# Patient Record
Sex: Female | Born: 1980 | Hispanic: Yes | Marital: Married | Smoking: Never smoker
Health system: Southern US, Community
[De-identification: ages and names within clinical notes are randomized; demographics above are authoritative.]

## PROBLEM LIST (undated history)

## (undated) DIAGNOSIS — J45909 Unspecified asthma, uncomplicated: Secondary | ICD-10-CM

## (undated) DIAGNOSIS — M5126 Other intervertebral disc displacement, lumbar region: Secondary | ICD-10-CM

## (undated) DIAGNOSIS — E538 Deficiency of other specified B group vitamins: Secondary | ICD-10-CM

## (undated) DIAGNOSIS — E559 Vitamin D deficiency, unspecified: Secondary | ICD-10-CM

## (undated) HISTORY — PX: INSERTION OF CONTRACEPTIVE CAPSULE: SHX680

## (undated) HISTORY — DX: Other intervertebral disc displacement, lumbar region: M51.26

## (undated) HISTORY — PX: NORPLANT REMOVAL: SHX5385

## (undated) HISTORY — DX: Deficiency of other specified B group vitamins: E53.8

## (undated) HISTORY — DX: Vitamin D deficiency, unspecified: E55.9

## (undated) HISTORY — DX: Unspecified asthma, uncomplicated: J45.909

## (undated) HISTORY — PX: OTHER SURGICAL HISTORY: SHX169

## (undated) HISTORY — PX: HIP SURGERY: SHX245

---

## 2013-10-06 HISTORY — PX: SPINE SURGERY: SHX786

## 2015-12-04 ENCOUNTER — Other Ambulatory Visit: Payer: Self-pay | Admitting: Obstetrics and Gynecology

## 2015-12-04 DIAGNOSIS — M79622 Pain in left upper arm: Secondary | ICD-10-CM

## 2015-12-10 ENCOUNTER — Ambulatory Visit
Admission: RE | Admit: 2015-12-10 | Discharge: 2015-12-10 | Disposition: A | Discharge status: Home / Self Care | Payer: 59 | Source: Ambulatory Visit | Attending: Obstetrics and Gynecology | Admitting: Obstetrics and Gynecology

## 2015-12-10 DIAGNOSIS — M79622 Pain in left upper arm: Secondary | ICD-10-CM

## 2015-12-26 ENCOUNTER — Ambulatory Visit (INDEPENDENT_AMBULATORY_CARE_PROVIDER_SITE_OTHER): Payer: 59 | Admitting: Medical

## 2015-12-26 ENCOUNTER — Encounter: Payer: Self-pay | Admitting: Medical

## 2015-12-26 VITALS — BP 128/84 | HR 74 | Ht 61.5 in | Wt 138.0 lb

## 2015-12-26 DIAGNOSIS — K5909 Other constipation: Secondary | ICD-10-CM

## 2015-12-26 DIAGNOSIS — K5900 Constipation, unspecified: Secondary | ICD-10-CM

## 2015-12-26 DIAGNOSIS — N83201 Unspecified ovarian cyst, right side: Secondary | ICD-10-CM

## 2015-12-26 DIAGNOSIS — R21 Rash and other nonspecific skin eruption: Secondary | ICD-10-CM

## 2015-12-26 LAB — TSH: TSH: 1.67 mIU/L

## 2015-12-26 LAB — CBC
HEMATOCRIT: 36.7 % (ref 36.0–46.0)
HEMOGLOBIN: 12.5 g/dL (ref 12.0–15.0)
MCH: 30 pg (ref 26.0–34.0)
MCHC: 34.1 g/dL (ref 30.0–36.0)
MCV: 88.2 fL (ref 78.0–100.0)
MPV: 11.3 fL (ref 8.6–12.4)
Platelets: 250 10*3/uL (ref 150–400)
RBC: 4.16 MIL/uL (ref 3.87–5.11)
RDW: 13 % (ref 11.5–15.5)
WBC: 5.7 10*3/uL (ref 4.0–10.5)

## 2015-12-26 MED ORDER — LINACLOTIDE 145 MCG PO CAPS: 145.0000 ug | ORAL_CAPSULE | Freq: Every day | ORAL | Status: DC

## 2015-12-26 NOTE — Progress Notes (Signed)
Subjective: Chief Complaint  Patient presents with  . New Patient (Initial Visit)    pt states that she has been getting bloating and pain in her lower abdomin area. has a cyst in her ovary but not sure if it related. Said passing gas or burping helps but has to take a laxative in order to go to the bathroom. Has a rash everytime she has her cycle.    Here as a new patient. From [place], moved to [place] in June.  Works for [employer]. Moved here for work.   She has several year hx/o constipation, bloating, gassy.   She notes having an ovarian cyst the size of a lemon on the right, not sure if this is related.   Keeps getting a lot of abdominal bloating, can't go to the bathroom without using a laxative, gets nauseated from feeling full all the time.  Pain is so bad that she bends over in pain.  Has BM once weekly if she uses laxative.   Has had this problems for years.  Drinks only water and coffee.  No juice, no soda, no tea.   Eats a good amount of fiber. Eats vegetables daily, good amount of grains.  Eats spinach most days.  Has used miralax occasionally, not sure it helps some.   No prior fiber supplement.   Eats a lot of onions ,but not so much cheese.  Sometimes eats beans.   Stool is often hard.  Has seen occasional bright red blood on the tissue some, with painful BMs.  Has had hemorrhoids occasionally.  Had colonoscopy years for same problems and was reportedly normal.  No polyps, no fissure.    Also has several year hx/o getting rash on forearm, legs, and abdomen.  Took a photo of this.  It comes on when period comes, but then slowly disappears within a week or 2, recurring every month.  LMP 12/17/15, lasted 5 days, but rash is still lingering, embarrassing.     The ovarian cyst is the size of a lemon.  Has hx/o abnormal pap, but HPV was negative 11/2015.  Is suppose to have repeat pap in 1 year.     History reviewed. No pertinent past medical history.  ROS as in  subjective   Objective BP 128/84 mmHg  Pulse 74  Ht 5' 1.5" (1.562 m)  Wt 138 lb (62.596 kg)  BMI 25.66 kg/m2  LMP 12/17/2015  General appearance: alert, no distress, WD/WN Oral cavity: MMM, no lesions Neck: supple, no lymphadenopathy, no thyromegaly, no masses Heart: RRR, normal S1, S2, no murmurs Lungs: CTA bilaterally, no wheezes, rhonchi, or rales Abdomen: +bs, soft, non tender, non distended, no masses, no hepatomegaly, no splenomegaly Pulses: 2+ symmetric, upper and lower extremities, normal cap refill Skin: faint maculopapular rash diffuse on forearms, lower legs     Assessment: Encounter Diagnoses  Name Primary?  . Chronic constipation Yes  . Cyst of right ovary   . Rash and nonspecific skin eruption       Plan: discussed concerns and symptoms that represent chronic constipation.  Begin trial of Linzess, gave samples and script.  discussed water and fiber intake.  Avoid onions.  F/u in 75mo.  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 I will discuss with Dr. Tomi Bamberger here and give her some feedback on this.

## 2016-01-29 ENCOUNTER — Ambulatory Visit (INDEPENDENT_AMBULATORY_CARE_PROVIDER_SITE_OTHER): Payer: 59 | Admitting: Medical

## 2016-01-29 ENCOUNTER — Encounter: Payer: Self-pay | Admitting: Medical

## 2016-01-29 VITALS — BP 120/84 | HR 75 | Wt 140.0 lb

## 2016-01-29 DIAGNOSIS — K5900 Constipation, unspecified: Secondary | ICD-10-CM | POA: Diagnosis not present

## 2016-01-29 DIAGNOSIS — K5909 Other constipation: Secondary | ICD-10-CM

## 2016-01-29 DIAGNOSIS — R21 Rash and other nonspecific skin eruption: Secondary | ICD-10-CM | POA: Diagnosis not present

## 2016-01-29 NOTE — Progress Notes (Signed)
Subjective: Chief Complaint  Patient presents with  . Follow-up    on linzess. no problems or concerns   Here for f/u on constipation.  I saw her a month ago as a new patient for constipation.   She has several year hx/o constipation, bloating, gassy.   She notes having an ovarian cyst the size of a lemon on the right, sees [facility] for this.   Has used miralax occasionally, not sure it helps some.   No prior fiber supplement.  Had colonoscopy years for same problems and was reportedly normal.  No polyps, no fissure.  Since last visit has seen slight improvement in bowel movements but still only getting 1-2 BMs weekly. She did cut out onions.     Also has several year hx/o getting rash on forearm, legs, and abdomen.   It comes on when period comes, but then slowly disappears within a week or 2, recurring every month.  History reviewed. No pertinent past medical history.  ROS as in subjective   Objective BP 120/84 mmHg  Pulse 75  Wt 140 lb (63.504 kg)  LMP 01/18/2016  General appearance: alert, no distress, WD/WN    Assessment: Encounter Diagnoses  Name Primary?  . Chronic constipation Yes  . Rash and nonspecific skin eruption       Plan: C/t good water and fiber intake, increase to Linzess 2105mcg, but also gave samples of Amitiza to try as well.   Discussed concerns and symptoms that represent chronic constipation.  Let me know in 2 weeks how this is doing.  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  We reviewed this again.   She will discuss with gynecology but if needed we can refer to dermatology as it sounds like her gynecologist didn't want to pursue eval/treatment for this based on patient's view of her recent visit with gyn.  Matilda was seen today for follow-up.  Diagnoses and all orders for this visit:  Chronic constipation  Rash and nonspecific skin eruption  Other orders -     linaclotide (LINZESS) 290 MCG CAPS capsule; Take 1  capsule (290 mcg total) by mouth daily before breakfast.

## 2016-01-29 NOTE — Patient Instructions (Signed)
Begin samples of Linzess 290 mcg once daily When you run out of Linzess, change to Amitiza  Take Amitiza 24 mcg twice daily to see if this works Therapist, nutritional?

## 2016-01-30 ENCOUNTER — Encounter: Payer: Self-pay | Admitting: Medical

## 2016-01-30 MED ORDER — LINACLOTIDE 290 MCG PO CAPS: 290.0000 ug | ORAL_CAPSULE | Freq: Every day | ORAL | Status: DC

## 2016-05-28 ENCOUNTER — Encounter (HOSPITAL_COMMUNITY): Payer: Self-pay | Admitting: Radiology

## 2016-05-28 ENCOUNTER — Emergency Department (HOSPITAL_COMMUNITY)
Admission: EM | Admit: 2016-05-28 | Discharge: 2016-05-28 | Disposition: A | Discharge status: Home / Self Care | Payer: 59 | Attending: Emergency Medicine | Admitting: Emergency Medicine

## 2016-05-28 ENCOUNTER — Emergency Department (HOSPITAL_COMMUNITY): Payer: 59

## 2016-05-28 DIAGNOSIS — Y999 Unspecified external cause status: Secondary | ICD-10-CM | POA: Diagnosis not present

## 2016-05-28 DIAGNOSIS — S8991XA Unspecified injury of right lower leg, initial encounter: Secondary | ICD-10-CM | POA: Diagnosis present

## 2016-05-28 DIAGNOSIS — R0789 Other chest pain: Secondary | ICD-10-CM | POA: Diagnosis not present

## 2016-05-28 DIAGNOSIS — S3991XA Unspecified injury of abdomen, initial encounter: Secondary | ICD-10-CM

## 2016-05-28 DIAGNOSIS — S50811A Abrasion of right forearm, initial encounter: Secondary | ICD-10-CM | POA: Diagnosis not present

## 2016-05-28 DIAGNOSIS — Y9389 Activity, other specified: Secondary | ICD-10-CM | POA: Insufficient documentation

## 2016-05-28 DIAGNOSIS — S8011XA Contusion of right lower leg, initial encounter: Secondary | ICD-10-CM | POA: Insufficient documentation

## 2016-05-28 DIAGNOSIS — Y92410 Unspecified street and highway as the place of occurrence of the external cause: Secondary | ICD-10-CM | POA: Insufficient documentation

## 2016-05-28 DIAGNOSIS — S40811A Abrasion of right upper arm, initial encounter: Secondary | ICD-10-CM | POA: Insufficient documentation

## 2016-05-28 DIAGNOSIS — T148XXA Other injury of unspecified body region, initial encounter: Secondary | ICD-10-CM

## 2016-05-28 LAB — CBC WITH DIFFERENTIAL/PLATELET
BASOS ABS: 0 10*3/uL (ref 0.0–0.1)
Basophils Relative: 0 %
EOS PCT: 2 %
Eosinophils Absolute: 0.1 10*3/uL (ref 0.0–0.7)
HCT: 36.8 % (ref 36.0–46.0)
Hemoglobin: 13 g/dL (ref 12.0–15.0)
LYMPHS PCT: 30 %
Lymphs Abs: 1.6 10*3/uL (ref 0.7–4.0)
MCH: 30.6 pg (ref 26.0–34.0)
MCHC: 35.3 g/dL (ref 30.0–36.0)
MCV: 86.6 fL (ref 78.0–100.0)
MONO ABS: 0.3 10*3/uL (ref 0.1–1.0)
Monocytes Relative: 6 %
Neutro Abs: 3.4 10*3/uL (ref 1.7–7.7)
Neutrophils Relative %: 62 %
PLATELETS: 195 10*3/uL (ref 150–400)
RBC: 4.25 MIL/uL (ref 3.87–5.11)
RDW: 12.1 % (ref 11.5–15.5)
WBC: 5.4 10*3/uL (ref 4.0–10.5)

## 2016-05-28 LAB — COMPREHENSIVE METABOLIC PANEL
ALBUMIN: 4.4 g/dL (ref 3.5–5.0)
ALK PHOS: 68 U/L (ref 38–126)
ALT: 13 U/L — ABNORMAL LOW (ref 14–54)
AST: 21 U/L (ref 15–41)
Anion gap: 5 (ref 5–15)
BILIRUBIN TOTAL: 0.5 mg/dL (ref 0.3–1.2)
BUN: 12 mg/dL (ref 6–20)
CO2: 26 mmol/L (ref 22–32)
Calcium: 8.9 mg/dL (ref 8.9–10.3)
Chloride: 108 mmol/L (ref 101–111)
Creatinine, Ser: 0.71 mg/dL (ref 0.44–1.00)
GFR calc Af Amer: >60 mL/min (ref >60)
GFR calc non Af Amer: >60 mL/min (ref >60)
GLUCOSE: 111 mg/dL — AB (ref 65–99)
POTASSIUM: 3.7 mmol/L (ref 3.5–5.1)
Sodium: 139 mmol/L (ref 135–145)
TOTAL PROTEIN: 7.4 g/dL (ref 6.5–8.1)

## 2016-05-28 LAB — URINALYSIS, ROUTINE W REFLEX MICROSCOPIC
BILIRUBIN URINE: NEGATIVE
GLUCOSE, UA: NEGATIVE mg/dL
HGB URINE DIPSTICK: NEGATIVE
Ketones, ur: NEGATIVE mg/dL
Leukocytes, UA: NEGATIVE
Nitrite: NEGATIVE
PH: 7 (ref 5.0–8.0)
Protein, ur: NEGATIVE mg/dL
SPECIFIC GRAVITY, URINE: 1.007 (ref 1.005–1.030)

## 2016-05-28 LAB — I-STAT BETA HCG BLOOD, ED (MC, WL, AP ONLY): I-stat hCG, quantitative: <5 m[IU]/mL (ref <5)

## 2016-05-28 MED ORDER — IBUPROFEN 600 MG PO TABS: 600.0000 mg | ORAL_TABLET | Freq: Four times a day (QID) | ORAL | 0 refills | Status: DC | PRN

## 2016-05-28 MED ORDER — SODIUM CHLORIDE 0.9 % IV SOLN
INTRAVENOUS | Status: DC
  Administered 2016-05-28: 10:00:00 via INTRAVENOUS

## 2016-05-28 MED ORDER — TRAMADOL HCL 50 MG PO TABS: 100.0000 mg | ORAL_TABLET | Freq: Four times a day (QID) | ORAL | 0 refills | Status: DC | PRN

## 2016-05-28 MED ORDER — IOPAMIDOL (ISOVUE-300) INJECTION 61%
100.0000 mL | Freq: Once | INTRAVENOUS | Status: AC | PRN
  Administered 2016-05-28: 100 mL via INTRAVENOUS

## 2016-05-28 MED ORDER — MORPHINE SULFATE (PF) 4 MG/ML IV SOLN
4.0000 mg | Freq: Once | INTRAVENOUS | Status: AC
  Administered 2016-05-28: 4 mg via INTRAVENOUS
  Filled 2016-05-28: qty 1

## 2016-05-28 MED ORDER — BACITRACIN ZINC 500 UNIT/GM EX OINT
TOPICAL_OINTMENT | CUTANEOUS | Status: AC
  Administered 2016-05-28: 2
  Filled 2016-05-28: qty 1.8
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
## 2016-05-29 ENCOUNTER — Emergency Department (HOSPITAL_COMMUNITY): Payer: 59

## 2016-05-29 ENCOUNTER — Emergency Department (HOSPITAL_COMMUNITY)
Admission: EM | Admit: 2016-05-29 | Discharge: 2016-05-29 | Disposition: A | Discharge status: Home / Self Care | Payer: 59 | Attending: Emergency Medicine | Admitting: Emergency Medicine

## 2016-05-29 ENCOUNTER — Encounter (HOSPITAL_COMMUNITY): Payer: Self-pay | Admitting: Emergency Medicine

## 2016-05-29 DIAGNOSIS — Y999 Unspecified external cause status: Secondary | ICD-10-CM | POA: Diagnosis not present

## 2016-05-29 DIAGNOSIS — Z79891 Long term (current) use of opiate analgesic: Secondary | ICD-10-CM | POA: Diagnosis not present

## 2016-05-29 DIAGNOSIS — R112 Nausea with vomiting, unspecified: Secondary | ICD-10-CM | POA: Diagnosis present

## 2016-05-29 DIAGNOSIS — R0602 Shortness of breath: Secondary | ICD-10-CM | POA: Diagnosis not present

## 2016-05-29 DIAGNOSIS — Y92410 Unspecified street and highway as the place of occurrence of the external cause: Secondary | ICD-10-CM | POA: Insufficient documentation

## 2016-05-29 DIAGNOSIS — R0789 Other chest pain: Secondary | ICD-10-CM

## 2016-05-29 DIAGNOSIS — Y939 Activity, unspecified: Secondary | ICD-10-CM | POA: Insufficient documentation

## 2016-05-29 LAB — URINALYSIS, ROUTINE W REFLEX MICROSCOPIC
BILIRUBIN URINE: NEGATIVE
GLUCOSE, UA: NEGATIVE mg/dL
HGB URINE DIPSTICK: NEGATIVE
KETONES UR: NEGATIVE mg/dL
Leukocytes, UA: NEGATIVE
Nitrite: NEGATIVE
PROTEIN: 30 mg/dL — AB
Specific Gravity, Urine: 1.027 (ref 1.005–1.030)
pH: 7.5 (ref 5.0–8.0)

## 2016-05-29 LAB — COMPREHENSIVE METABOLIC PANEL
ALK PHOS: 68 U/L (ref 38–126)
ALT: 17 U/L (ref 14–54)
ANION GAP: 7 (ref 5–15)
AST: 25 U/L (ref 15–41)
Albumin: 4.7 g/dL (ref 3.5–5.0)
BILIRUBIN TOTAL: 0.7 mg/dL (ref 0.3–1.2)
BUN: 14 mg/dL (ref 6–20)
CHLORIDE: 104 mmol/L (ref 101–111)
CO2: 26 mmol/L (ref 22–32)
CREATININE: 0.72 mg/dL (ref 0.44–1.00)
Calcium: 9 mg/dL (ref 8.9–10.3)
GFR calc non Af Amer: >60 mL/min (ref >60)
Glucose, Bld: 116 mg/dL — ABNORMAL HIGH (ref 65–99)
Potassium: 3.7 mmol/L (ref 3.5–5.1)
Sodium: 137 mmol/L (ref 135–145)
Total Protein: 7.6 g/dL (ref 6.5–8.1)

## 2016-05-29 LAB — CBC
HCT: 39 % (ref 36.0–46.0)
Hemoglobin: 13.3 g/dL (ref 12.0–15.0)
MCH: 30.3 pg (ref 26.0–34.0)
MCHC: 34.1 g/dL (ref 30.0–36.0)
MCV: 88.8 fL (ref 78.0–100.0)
PLATELETS: 197 10*3/uL (ref 150–400)
RBC: 4.39 MIL/uL (ref 3.87–5.11)
RDW: 12.3 % (ref 11.5–15.5)
WBC: 7.3 10*3/uL (ref 4.0–10.5)

## 2016-05-29 LAB — URINE MICROSCOPIC-ADD ON

## 2016-05-29 LAB — I-STAT TROPONIN, ED: Troponin i, poc: 0 ng/mL (ref 0.00–0.08)

## 2016-05-29 LAB — LIPASE, BLOOD: Lipase: 14 U/L (ref 11–51)

## 2016-05-29 MED ORDER — METHOCARBAMOL 500 MG PO TABS: 500.0000 mg | ORAL_TABLET | Freq: Four times a day (QID) | ORAL | 0 refills | Status: DC

## 2016-05-29 MED ORDER — KETOROLAC TROMETHAMINE 60 MG/2ML IM SOLN
60.0000 mg | Freq: Once | INTRAMUSCULAR | Status: AC
  Administered 2016-05-29: 60 mg via INTRAMUSCULAR
  Filled 2016-05-29: qty 2

## 2016-05-29 MED ORDER — MORPHINE SULFATE (PF) 4 MG/ML IV SOLN
4.0000 mg | Freq: Once | INTRAVENOUS | Status: AC
  Administered 2016-05-29: 4 mg via INTRAMUSCULAR
  Filled 2016-05-29: qty 1

## 2016-05-29 MED ORDER — ONDANSETRON 8 MG PO TBDP: 8.0000 mg | ORAL_TABLET | Freq: Three times a day (TID) | ORAL | 0 refills | Status: DC | PRN

## 2016-05-29 MED ORDER — ONDANSETRON 4 MG PO TBDP
4.0000 mg | ORAL_TABLET | Freq: Once | ORAL | Status: AC | PRN
  Administered 2016-05-29: 4 mg via ORAL
  Filled 2016-05-29: qty 1

## 2016-05-29 MED ORDER — HYDROCODONE-ACETAMINOPHEN 5-325 MG PO TABS: 1.0000 | ORAL_TABLET | ORAL | 0 refills | Status: DC | PRN

## 2016-05-29 MED ORDER — LORAZEPAM 2 MG/ML IJ SOLN
1.0000 mg | Freq: Once | INTRAMUSCULAR | Status: AC
  Administered 2016-05-29: 1 mg via INTRAMUSCULAR
  Filled 2016-05-29: qty 1

## 2016-05-29 NOTE — ED Notes (Signed)
Labs being drawn by phlebotomy.
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
## 2016-06-04 ENCOUNTER — Encounter: Payer: Self-pay | Admitting: Medical

## 2016-06-04 ENCOUNTER — Ambulatory Visit (INDEPENDENT_AMBULATORY_CARE_PROVIDER_SITE_OTHER): Payer: 59 | Admitting: Medical

## 2016-06-04 ENCOUNTER — Telehealth: Payer: Self-pay | Admitting: Medical

## 2016-06-04 VITALS — BP 130/88 | HR 81 | Wt 137.0 lb

## 2016-06-04 DIAGNOSIS — M79601 Pain in right arm: Secondary | ICD-10-CM | POA: Diagnosis not present

## 2016-06-04 DIAGNOSIS — S40029D Contusion of unspecified upper arm, subsequent encounter: Secondary | ICD-10-CM | POA: Diagnosis not present

## 2016-06-04 DIAGNOSIS — M79604 Pain in right leg: Secondary | ICD-10-CM | POA: Insufficient documentation

## 2016-06-04 DIAGNOSIS — R0789 Other chest pain: Secondary | ICD-10-CM | POA: Diagnosis not present

## 2016-06-04 DIAGNOSIS — T148 Other injury of unspecified body region: Secondary | ICD-10-CM

## 2016-06-04 DIAGNOSIS — S40029A Contusion of unspecified upper arm, initial encounter: Secondary | ICD-10-CM | POA: Insufficient documentation

## 2016-06-04 DIAGNOSIS — M5441 Lumbago with sciatica, right side: Secondary | ICD-10-CM | POA: Insufficient documentation

## 2016-06-04 DIAGNOSIS — R202 Paresthesia of skin: Secondary | ICD-10-CM | POA: Diagnosis not present

## 2016-06-04 DIAGNOSIS — T07XXXA Unspecified multiple injuries, initial encounter: Secondary | ICD-10-CM | POA: Insufficient documentation
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
## 2016-06-04 NOTE — Telephone Encounter (Signed)
Waiting on a copy of finished OV and then it will be faxed to [facility] who will contact the pt

## 2016-06-04 NOTE — Telephone Encounter (Signed)
Pt called & wanted info on the physical therapy you had recommended & I advised someone would send referral for that and call her back

## 2016-06-05 NOTE — Telephone Encounter (Signed)
Faxed referral

## 2016-06-05 NOTE — Telephone Encounter (Signed)
Chart ready

## 2016-06-12 ENCOUNTER — Encounter: Payer: Self-pay | Admitting: Internal Medicine

## 2016-06-27 ENCOUNTER — Encounter: Payer: Self-pay | Admitting: Medical

## 2016-06-27 ENCOUNTER — Telehealth: Payer: Self-pay | Admitting: Medical

## 2016-06-27 ENCOUNTER — Ambulatory Visit
Admission: RE | Admit: 2016-06-27 | Discharge: 2016-06-27 | Disposition: A | Discharge status: Home / Self Care | Payer: 59 | Source: Ambulatory Visit | Attending: Medical | Admitting: Medical

## 2016-06-27 ENCOUNTER — Ambulatory Visit (INDEPENDENT_AMBULATORY_CARE_PROVIDER_SITE_OTHER): Payer: 59 | Admitting: Medical

## 2016-06-27 VITALS — BP 130/84 | HR 68 | Ht 61.5 in | Wt 138.0 lb

## 2016-06-27 DIAGNOSIS — M79604 Pain in right leg: Secondary | ICD-10-CM

## 2016-06-27 DIAGNOSIS — S40029D Contusion of unspecified upper arm, subsequent encounter: Secondary | ICD-10-CM | POA: Diagnosis not present

## 2016-06-27 DIAGNOSIS — M549 Dorsalgia, unspecified: Secondary | ICD-10-CM

## 2016-06-27 DIAGNOSIS — R202 Paresthesia of skin: Secondary | ICD-10-CM

## 2016-06-27 DIAGNOSIS — M79601 Pain in right arm: Secondary | ICD-10-CM | POA: Diagnosis not present

## 2016-06-27 DIAGNOSIS — R200 Anesthesia of skin: Secondary | ICD-10-CM

## 2016-06-27 NOTE — Telephone Encounter (Signed)
Once Kelly Figueroa has completed his notes from today's visit, I will send referral over to [facility]. I have put referral in
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
## 2016-07-07 ENCOUNTER — Telehealth: Payer: Self-pay | Admitting: Medical

## 2016-07-07 DIAGNOSIS — M79601 Pain in right arm: Secondary | ICD-10-CM

## 2016-07-07 NOTE — Telephone Encounter (Signed)
Pt called and stated that physical therapy has told her they think she should see vascular and vein specialist. They need a referral before she can be seen. Please fax to vascular and vein at [phone]. Pt can be reached at [phone].

## 2016-07-08 NOTE — Telephone Encounter (Signed)
Is this regarding the right forearm/hand?  What are they seeing of concern, what is worse?

## 2016-07-08 NOTE — Telephone Encounter (Signed)
Called pt, no answer, unable to leave vm due to "mailbox being full"

## 2016-07-09 NOTE — Telephone Encounter (Signed)
Tried to call pt but VM is not set up yet.

## 2016-07-09 NOTE — Telephone Encounter (Signed)
pls refer to vein and vascular clinic

## 2016-07-09 NOTE — Telephone Encounter (Signed)
PT tells her that she has decreased circulation in her arm. Victorino December

## 2016-07-10 NOTE — Telephone Encounter (Signed)
Referral ordered

## 2016-07-11 ENCOUNTER — Telehealth: Payer: Self-pay

## 2016-07-11 NOTE — Telephone Encounter (Signed)
I got a phone call yesterday about referral to vein specialist, now today request for neurology.   yesterday's message said that physical therapy was worried about blood flow.  If there is confusion, bring her in for follow up first.

## 2016-07-11 NOTE — Telephone Encounter (Signed)
Attempted call to pt with no answer, no VCM.

## 2016-07-11 NOTE — Telephone Encounter (Signed)
Pt request referral to neurologist for Rt arm pain. She states that she would like to go to [place] [facility]. [phone].

## 2016-07-14 ENCOUNTER — Telehealth: Payer: Self-pay

## 2016-07-14 ENCOUNTER — Telehealth: Payer: Self-pay | Admitting: Medical

## 2016-07-14 ENCOUNTER — Ambulatory Visit (INDEPENDENT_AMBULATORY_CARE_PROVIDER_SITE_OTHER): Payer: 59 | Admitting: Medical

## 2016-07-14 ENCOUNTER — Encounter: Payer: Self-pay | Admitting: Medical

## 2016-07-14 DIAGNOSIS — M79601 Pain in right arm: Secondary | ICD-10-CM

## 2016-07-14 DIAGNOSIS — T07XXXA Unspecified multiple injuries, initial encounter: Secondary | ICD-10-CM

## 2016-07-14 DIAGNOSIS — R0989 Other specified symptoms and signs involving the circulatory and respiratory systems: Secondary | ICD-10-CM | POA: Diagnosis not present

## 2016-07-14 DIAGNOSIS — S40029D Contusion of unspecified upper arm, subsequent encounter: Secondary | ICD-10-CM | POA: Diagnosis not present

## 2016-07-14 DIAGNOSIS — M79604 Pain in right leg: Secondary | ICD-10-CM | POA: Diagnosis not present

## 2016-07-14 DIAGNOSIS — M5441 Lumbago with sciatica, right side: Secondary | ICD-10-CM

## 2016-07-14 DIAGNOSIS — R200 Anesthesia of skin: Secondary | ICD-10-CM

## 2016-07-14 DIAGNOSIS — M79661 Pain in right lower leg: Secondary | ICD-10-CM

## 2016-07-14 DIAGNOSIS — R202 Paresthesia of skin: Secondary | ICD-10-CM | POA: Diagnosis not present

## 2016-07-14 LAB — BASIC METABOLIC PANEL
BUN: 10 mg/dL (ref 7–25)
CHLORIDE: 107 mmol/L (ref 98–110)
CO2: 24 mmol/L (ref 20–31)
Calcium: 8.9 mg/dL (ref 8.6–10.2)
Creat: 0.83 mg/dL (ref 0.50–1.10)
Glucose, Bld: 84 mg/dL (ref 65–99)
POTASSIUM: 4.2 mmol/L (ref 3.5–5.3)
Sodium: 138 mmol/L (ref 135–146)

## 2016-07-14 NOTE — Telephone Encounter (Signed)
Pt states referral to neurologist placed today can not see her until Dec 2017. She would like to be referred [facility] off of [facility]. Please call pt when referral is placed. [phone]. Kelly Figueroa

## 2016-07-14 NOTE — Telephone Encounter (Signed)
Pt scheduled appt

## 2016-07-14 NOTE — Telephone Encounter (Signed)
Please coordinate with her.  I recommend Dr. [facility], but if she has a different neurologist she wants, then she needs to give you the contact info.   The only 2 local offices are [place] and [place].    She may be thinking [facility], however, she would need to see neurology before going that route given the findings and symptoms.

## 2016-07-14 NOTE — Telephone Encounter (Signed)
pls call and have [facility] send records from where we referred her.  If I refer to a specialist, I expect a specialist to send me office notes of that visit.  Please let them know I didn't get the notes.

## 2016-07-14 NOTE — Telephone Encounter (Signed)
There is not a [facility] in [place], there is a [facility].  Please advise how you would like for me to handle.
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
## 2016-07-15 ENCOUNTER — Other Ambulatory Visit: Payer: Self-pay | Admitting: Vascular Surgery

## 2016-07-15 DIAGNOSIS — M79604 Pain in right leg: Secondary | ICD-10-CM

## 2016-07-15 DIAGNOSIS — M79601 Pain in right arm: Secondary | ICD-10-CM

## 2016-07-15 LAB — D-DIMER, QUANTITATIVE: D-Dimer, Quant: 0.35 mcg/mL FEU (ref <0.50)

## 2016-07-15 NOTE — Telephone Encounter (Signed)
Records are being faxed to [facility] this morning from [place]

## 2016-07-15 NOTE — Telephone Encounter (Signed)
Referral to [facility] has been completed.

## 2016-07-15 NOTE — Telephone Encounter (Addendum)
Yes, Referral was placed at [facility], they are unable to see her until Dec.  Advised pt that she could ask [facility] to be put on the waiting list, which would prompt them to call her if any cancellations, Pt was agreeable to this.    Also described to the pt the difference between Neurology and Nerosurgery, and told her that I could send a referral to [facility] to see if they could see her sooner. Patient was agreeable to this too.

## 2016-07-17 ENCOUNTER — Encounter: Payer: Self-pay | Admitting: Vascular Surgery

## 2016-07-18 ENCOUNTER — Ambulatory Visit (HOSPITAL_COMMUNITY)
Admission: RE | Admit: 2016-07-18 | Discharge: 2016-07-18 | Disposition: A | Discharge status: Home / Self Care | Payer: No Typology Code available for payment source | Source: Ambulatory Visit | Attending: Vascular Surgery | Admitting: Vascular Surgery

## 2016-07-18 ENCOUNTER — Encounter: Payer: Self-pay | Admitting: Vascular Surgery

## 2016-07-18 ENCOUNTER — Ambulatory Visit (INDEPENDENT_AMBULATORY_CARE_PROVIDER_SITE_OTHER): Payer: 59 | Admitting: Vascular Surgery

## 2016-07-18 VITALS — BP 102/75 | HR 75 | Temp 99.0°F | Resp 16 | Ht 61.5 in | Wt 139.0 lb

## 2016-07-18 DIAGNOSIS — M79604 Pain in right leg: Secondary | ICD-10-CM

## 2016-07-18 DIAGNOSIS — M79601 Pain in right arm: Secondary | ICD-10-CM

## 2016-07-18 DIAGNOSIS — G894 Chronic pain syndrome: Secondary | ICD-10-CM | POA: Diagnosis not present
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
## 2016-08-01 ENCOUNTER — Encounter: Payer: 59 | Admitting: Vascular Surgery

## 2016-08-01 ENCOUNTER — Encounter (HOSPITAL_COMMUNITY): Payer: 59

## 2016-09-08 ENCOUNTER — Ambulatory Visit: Payer: 59 | Admitting: Neurology

## 2016-09-26 ENCOUNTER — Encounter: Payer: Self-pay | Admitting: Neurology

## 2016-09-26 ENCOUNTER — Ambulatory Visit (INDEPENDENT_AMBULATORY_CARE_PROVIDER_SITE_OTHER): Payer: 59 | Admitting: Neurology

## 2016-09-26 VITALS — BP 120/80 | HR 70 | Ht 61.5 in | Wt 143.3 lb

## 2016-09-26 DIAGNOSIS — R252 Cramp and spasm: Secondary | ICD-10-CM

## 2016-09-26 DIAGNOSIS — G5621 Lesion of ulnar nerve, right upper limb: Secondary | ICD-10-CM | POA: Diagnosis not present

## 2016-09-26 NOTE — Patient Instructions (Addendum)
1: Start magnesium oxide 400 mg every day for 5 days then one twice/day.  2. Start doing leg stretches  3. NCS/EMG of the right arm and leg  Return to clinic in 3 months
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
## 2016-10-09 ENCOUNTER — Encounter: Payer: 59 | Admitting: Neurology

## 2016-10-16 ENCOUNTER — Ambulatory Visit (INDEPENDENT_AMBULATORY_CARE_PROVIDER_SITE_OTHER): Payer: 59 | Admitting: Neurology

## 2016-10-16 DIAGNOSIS — G5621 Lesion of ulnar nerve, right upper limb: Secondary | ICD-10-CM

## 2016-10-16 DIAGNOSIS — Z719 Counseling, unspecified: Secondary | ICD-10-CM | POA: Diagnosis not present

## 2016-10-16 DIAGNOSIS — M5417 Radiculopathy, lumbosacral region: Secondary | ICD-10-CM

## 2016-10-16 DIAGNOSIS — R252 Cramp and spasm: Secondary | ICD-10-CM

## 2016-10-16 MED ORDER — CYCLOBENZAPRINE HCL 5 MG PO TABS: 5.0000 mg | ORAL_TABLET | Freq: Every evening | ORAL | 3 refills | Status: DC | PRN
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
## 2016-10-17 ENCOUNTER — Ambulatory Visit (INDEPENDENT_AMBULATORY_CARE_PROVIDER_SITE_OTHER): Payer: 59 | Admitting: Medical

## 2016-10-17 VITALS — BP 120/80 | HR 69 | Temp 98.6°F | Wt 144.8 lb

## 2016-10-17 DIAGNOSIS — Z20828 Contact with and (suspected) exposure to other viral communicable diseases: Secondary | ICD-10-CM

## 2016-10-17 DIAGNOSIS — J988 Other specified respiratory disorders: Secondary | ICD-10-CM

## 2016-10-17 MED ORDER — AMOXICILLIN 500 MG PO TABS: ORAL_TABLET | ORAL | 0 refills | Status: DC

## 2016-10-17 MED ORDER — OSELTAMIVIR PHOSPHATE 75 MG PO CAPS: 75.0000 mg | ORAL_CAPSULE | Freq: Two times a day (BID) | ORAL | 0 refills | Status: DC

## 2016-10-17 NOTE — Progress Notes (Signed)
Subjective: Chief Complaint  Patient presents with  . cough    cough for the last week. not getting any better   Here for about a week of illness, sore throat, dry cough, congested, upp air way, ears feel under pressure, upper teeth pain, coughing a lot at night, getting up lots of phlegm in morning.  Has faint prickly rash of right forearm.  Denies fever, has some nausea.  No vomiting, no diarrhea.  +sick contacts, son with throat.   No wheezing, no sob.   Using Nyquil. Which helps at night, but she does get a lot of phlegm morning.  Feels like her symptoms are worsening.   She also is concerned as 4 people in her office were diagnosed with flu last week.  Patient is not a smoker. No other aggravating or relieving factors.  No other c/o.  The following portions of the patient's history were reviewed and updated as appropriate: allergies, current medications, past family history, past medical history, past social history, past surgical history and problem list.  ROS as in subjective  Past Medical History:  Diagnosis Date  . Lumbar disc herniation      Objective: BP 120/80   Pulse 69   Temp 98.6 F (37 C) (Oral)   Wt 144 lb 12.8 oz (65.7 kg)   SpO2 98%   BMI 26.92 kg/m   General appearance: Alert, WD/WN, no distress                            Skin: warm, no rash, no diaphoresis                           Head: no sinus tenderness                            Eyes: conjunctiva normal, corneas clear, PERRLA                            Ears: flat TMs, external ear canals normal                          Nose: septum midline, turbinates swollen, with erythema and mucoid discharge             Mouth/throat: MMM, tongue normal, mild pharyngeal erythema                           Neck: supple, no adenopathy, no thyromegaly, nontender                         Lungs: clear, no wheezes, no rales                Extremities: no edema, nontender      Assessment: Encounter Diagnoses  Name  Primary?  Marland Kitchen Respiratory tract infection Yes  . Exposure to the flu      Plan:  Given her worsening sinus and congestion symptoms, will begin round of amoxicillin.  C/t Nyquil/Dayquil, rest, hydration.  Discussed usual time frame to see resolution of symptoms.   She is worried about flu.   Her symptoms and exam do not suggest influenza.  However, given her exposure, wrote for tamiflu in the event she becomes symptomatic as discussed.  Call/return if worse or not improving.

## 2016-10-20 ENCOUNTER — Other Ambulatory Visit: Payer: Self-pay | Admitting: *Deleted

## 2016-10-20 ENCOUNTER — Telehealth: Payer: Self-pay | Admitting: *Deleted

## 2016-10-20 ENCOUNTER — Telehealth: Payer: Self-pay | Admitting: Neurology

## 2016-10-20 DIAGNOSIS — R202 Paresthesia of skin: Secondary | ICD-10-CM

## 2016-10-20 DIAGNOSIS — M79604 Pain in right leg: Secondary | ICD-10-CM

## 2016-10-20 DIAGNOSIS — M549 Dorsalgia, unspecified: Secondary | ICD-10-CM

## 2016-10-20 DIAGNOSIS — M25569 Pain in unspecified knee: Secondary | ICD-10-CM

## 2016-10-20 DIAGNOSIS — M5441 Lumbago with sciatica, right side: Secondary | ICD-10-CM

## 2016-10-20 NOTE — Telephone Encounter (Signed)
Pt called and would like to also have the MRI of her neck down to her lower back at the same time she is having' The lower back.  Said she didn't want to go through another MRI and wanted to make sure nothing was missed.  Please call her.   MRI is scheduled for Friday.

## 2016-10-20 NOTE — Telephone Encounter (Signed)
Patient has been given the results and would like to have MRI done.  Order placed for MRI.

## 2016-10-20 NOTE — Telephone Encounter (Signed)
? 

## 2016-10-20 NOTE — Telephone Encounter (Signed)
Please advise 

## 2016-10-20 NOTE — Telephone Encounter (Signed)
-----   Message from Alda Berthold, DO sent at 10/17/2016 12:43 PM EST ----- Please inform patient that her nerve testing of the arm was normal and testing of the legs shows possible nerve impingement. I recommend MRI lumbar spine to investigate her symptoms. If she is agreeable, please order MRI L-spine without contrast.

## 2016-10-20 NOTE — Telephone Encounter (Signed)
Order place.  [facility] will call patient to schedule.

## 2016-10-24 ENCOUNTER — Ambulatory Visit
Admission: RE | Admit: 2016-10-24 | Discharge: 2016-10-24 | Disposition: A | Discharge status: Home / Self Care | Payer: 59 | Source: Ambulatory Visit | Attending: Neurology | Admitting: Neurology

## 2016-10-24 ENCOUNTER — Other Ambulatory Visit: Payer: 59

## 2016-10-24 DIAGNOSIS — R202 Paresthesia of skin: Secondary | ICD-10-CM

## 2016-10-24 DIAGNOSIS — M549 Dorsalgia, unspecified: Secondary | ICD-10-CM

## 2016-10-24 DIAGNOSIS — M5441 Lumbago with sciatica, right side: Secondary | ICD-10-CM

## 2016-10-24 DIAGNOSIS — M25569 Pain in unspecified knee: Secondary | ICD-10-CM

## 2016-10-24 DIAGNOSIS — M5126 Other intervertebral disc displacement, lumbar region: Secondary | ICD-10-CM | POA: Diagnosis not present

## 2016-10-24 DIAGNOSIS — M79604 Pain in right leg: Secondary | ICD-10-CM

## 2016-10-24 DIAGNOSIS — R200 Anesthesia of skin: Secondary | ICD-10-CM | POA: Diagnosis not present

## 2016-10-27 ENCOUNTER — Telehealth: Payer: Self-pay | Admitting: Neurology

## 2016-10-27 NOTE — Telephone Encounter (Signed)
Results of MRI cervical and lumbar spine discussed with patient.  No evidence of nerve impingement is seen.  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  She will follow-up with her provider regarding this.  For her muscle spasms, I asked her to increase flexeril to 10mg  at bedtime prn.  She has already completed PT without any benefit.  Donika K. Posey Pronto, DO

## 2016-12-26 ENCOUNTER — Ambulatory Visit: Payer: 59 | Admitting: Neurology

## 2017-01-01 DIAGNOSIS — N943 Premenstrual tension syndrome: Secondary | ICD-10-CM | POA: Diagnosis not present

## 2017-01-01 DIAGNOSIS — R946 Abnormal results of thyroid function studies: Secondary | ICD-10-CM | POA: Diagnosis not present

## 2017-01-12 DIAGNOSIS — Z124 Encounter for screening for malignant neoplasm of cervix: Secondary | ICD-10-CM | POA: Diagnosis not present

## 2017-01-12 DIAGNOSIS — Z01419 Encounter for gynecological examination (general) (routine) without abnormal findings: Secondary | ICD-10-CM | POA: Diagnosis not present

## 2017-01-21 DIAGNOSIS — E039 Hypothyroidism, unspecified: Secondary | ICD-10-CM | POA: Diagnosis not present

## 2017-02-11 ENCOUNTER — Ambulatory Visit (INDEPENDENT_AMBULATORY_CARE_PROVIDER_SITE_OTHER): Payer: 59 | Admitting: Neurology

## 2017-02-11 ENCOUNTER — Encounter: Payer: Self-pay | Admitting: Neurology

## 2017-02-11 VITALS — BP 110/70 | HR 77 | Ht 61.5 in | Wt 144.5 lb

## 2017-02-11 DIAGNOSIS — R252 Cramp and spasm: Secondary | ICD-10-CM | POA: Diagnosis not present

## 2017-02-11 DIAGNOSIS — M25551 Pain in right hip: Secondary | ICD-10-CM

## 2017-02-11 NOTE — Patient Instructions (Addendum)
Referral to Sports Medicine for right hip pain
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
## 2017-03-06 ENCOUNTER — Encounter: Payer: Self-pay | Admitting: Family Medicine

## 2017-03-06 ENCOUNTER — Ambulatory Visit (INDEPENDENT_AMBULATORY_CARE_PROVIDER_SITE_OTHER)
Admission: RE | Admit: 2017-03-06 | Discharge: 2017-03-06 | Disposition: A | Discharge status: Home / Self Care | Payer: 59 | Source: Ambulatory Visit | Attending: Family Medicine | Admitting: Family Medicine

## 2017-03-06 ENCOUNTER — Ambulatory Visit (INDEPENDENT_AMBULATORY_CARE_PROVIDER_SITE_OTHER): Payer: 59 | Admitting: Family Medicine

## 2017-03-06 VITALS — BP 106/80 | HR 56 | Ht 63.0 in | Wt 145.0 lb

## 2017-03-06 DIAGNOSIS — M25551 Pain in right hip: Secondary | ICD-10-CM | POA: Diagnosis not present

## 2017-03-06 MED ORDER — PREDNISONE 50 MG PO TABS: 50.0000 mg | ORAL_TABLET | Freq: Every day | ORAL | 0 refills | Status: DC

## 2017-03-06 NOTE — Patient Instructions (Signed)
Good to see you  We will figure it out.  Xray of hip downstairs today  Ice 20 minutes 2 times daily. Usually after activity and before bed. Exercises 3 times a week.  Prednisone daily for 5 days.  Over the counter Vitamin D 2000 IU daily  B12 1019mcg daily  B6 200mg  daily  Of the bruise on the leg try arnica lotion 2 times a day  See me again in 2 weeks (OK to double book)

## 2017-03-06 NOTE — Assessment & Plan Note (Signed)
Difficult to diagnose for sure today. Patient will have x-rays done. No rule out any femoral acetabular impingement, differential also includes a hip flexor tendinitis, possible labral pathology but no pain with internal range of motion. Very early avascular necrosis could also be a concern. Lumbar radiculopathy is also a possibility. He now may be there is a potential for a joint effusion and prednisone given. Discussed potential side effects. We discussed icing regimen and home exercises. Discussed over-the-counter medications a could be beneficial. Patient work with Product/process development scientist to learn home exercises in greater detail. Patient will continue to be active but we will discuss more low impact exercises. Follow-up again in 2-3 weeks. Advance imaging may be warranted.

## 2017-03-16 ENCOUNTER — Ambulatory Visit: Payer: 59 | Admitting: Neurology

## 2017-03-20 ENCOUNTER — Other Ambulatory Visit (INDEPENDENT_AMBULATORY_CARE_PROVIDER_SITE_OTHER): Payer: 59

## 2017-03-20 ENCOUNTER — Ambulatory Visit (INDEPENDENT_AMBULATORY_CARE_PROVIDER_SITE_OTHER): Payer: 59 | Admitting: Family Medicine

## 2017-03-20 ENCOUNTER — Encounter: Payer: Self-pay | Admitting: Family Medicine

## 2017-03-20 VITALS — BP 98/70 | HR 66 | Ht 63.0 in | Wt 146.0 lb

## 2017-03-20 DIAGNOSIS — M25551 Pain in right hip: Secondary | ICD-10-CM

## 2017-03-20 LAB — SEDIMENTATION RATE: SED RATE: 6 mm/h (ref 0–20)

## 2017-03-20 LAB — VITAMIN D 25 HYDROXY (VIT D DEFICIENCY, FRACTURES): VITD: 19.78 ng/mL — ABNORMAL LOW (ref 30.00–100.00)

## 2017-03-20 LAB — IBC PANEL
Iron: 66 ug/dL (ref 42–145)
Saturation Ratios: 18.7 % — ABNORMAL LOW (ref 20.0–50.0)
Transferrin: 252 mg/dL (ref 212.0–360.0)

## 2017-03-20 LAB — MAGNESIUM: MAGNESIUM: 1.9 mg/dL (ref 1.5–2.5)

## 2017-03-20 NOTE — Patient Instructions (Signed)
Good to see you  I am sorry you are not better Ice is still your friend Get labs downstairs just to make sure.  MR-arthrogram ordered today  Stay active if yo ucan  Once I get the MRi we will discuss.
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
## 2017-03-20 NOTE — Assessment & Plan Note (Addendum)
Patient continues to have significant right hip pain and worsening symptoms.. Seems to be significantly out of proportion for the amount of pain that she hasn't. Patient was having some mild low back pain and does have some history of back surgery. This could be a recurrent of the nerve impingement. Discussed with patient at this point I do feel an MR arthrogram of the hip is necessary. Patient is very tender and is having difficulty with daily activities such as walking. Depending on findings this could change medical management. Patient come back and see me again after the MRI to discuss further treatment options.

## 2017-03-21 LAB — CALCIUM, IONIZED: CALCIUM ION: 5 mg/dL (ref 4.8–5.6)

## 2017-03-21 LAB — RHEUMATOID FACTOR

## 2017-03-23 ENCOUNTER — Encounter: Payer: Self-pay | Admitting: Family Medicine

## 2017-03-23 LAB — PTH, INTACT AND CALCIUM
CALCIUM: 9 mg/dL (ref 8.6–10.2)
PTH: 59 pg/mL (ref 14–64)

## 2017-03-23 LAB — ANA: Anti Nuclear Antibody(ANA): NEGATIVE

## 2017-03-23 LAB — CYCLIC CITRUL PEPTIDE ANTIBODY, IGG

## 2017-04-15 ENCOUNTER — Inpatient Hospital Stay
Admission: RE | Admit: 2017-04-15 | Discharge: 2017-04-15 | Disposition: A | Discharge status: Home / Self Care | Payer: 59 | Source: Ambulatory Visit | Attending: Family Medicine | Admitting: Family Medicine

## 2017-04-15 ENCOUNTER — Other Ambulatory Visit: Payer: 59

## 2017-04-16 ENCOUNTER — Encounter: Payer: Self-pay | Admitting: Family Medicine

## 2017-05-21 ENCOUNTER — Encounter: Payer: Self-pay | Admitting: Vascular Surgery

## 2017-05-21 ENCOUNTER — Encounter: Payer: Self-pay | Admitting: Family Medicine

## 2017-05-26 ENCOUNTER — Telehealth: Payer: Self-pay | Admitting: Vascular Surgery

## 2017-05-26 NOTE — Telephone Encounter (Signed)
-----   Message from Reola Calkins, [place] sent at 05/26/2017  1:22 PM EDT ----- Please schedule this pt to see [place]. Thank you.   Dear Dr. Donzetta Matters,   I am having a lot of Leg Cramps on my right leg. It is the strongest near my calf. I can sometimes feel my veins pumping if that makes sense. I also have a bruise near that area that has been there for sometime.   I am nervous I can have a blood clot of some sort.   Can I schedule an appointment to have this look at

## 2017-05-26 NOTE — Telephone Encounter (Signed)
Sched appt 06/05/17 at 11:45. Lm on hm#.

## 2017-05-27 ENCOUNTER — Ambulatory Visit
Admission: RE | Admit: 2017-05-27 | Discharge: 2017-05-27 | Disposition: A | Discharge status: Home / Self Care | Payer: 59 | Source: Ambulatory Visit | Attending: Family Medicine | Admitting: Family Medicine

## 2017-05-27 DIAGNOSIS — S73191A Other sprain of right hip, initial encounter: Secondary | ICD-10-CM | POA: Diagnosis not present

## 2017-05-27 DIAGNOSIS — M25551 Pain in right hip: Secondary | ICD-10-CM

## 2017-05-27 MED ORDER — IOPAMIDOL (ISOVUE-M 200) INJECTION 41%: 15.0000 mL | Freq: Once | INTRAMUSCULAR | Status: DC

## 2017-06-02 ENCOUNTER — Encounter: Payer: Self-pay | Admitting: Family Medicine

## 2017-06-03 ENCOUNTER — Encounter: Payer: Self-pay | Admitting: Vascular Surgery

## 2017-06-05 ENCOUNTER — Ambulatory Visit (INDEPENDENT_AMBULATORY_CARE_PROVIDER_SITE_OTHER): Payer: 59 | Admitting: Vascular Surgery

## 2017-06-05 ENCOUNTER — Encounter: Payer: Self-pay | Admitting: Vascular Surgery

## 2017-06-05 VITALS — BP 126/77 | HR 58 | Temp 98.2°F | Resp 14 | Ht 63.0 in | Wt 148.0 lb

## 2017-06-05 DIAGNOSIS — G894 Chronic pain syndrome: Secondary | ICD-10-CM
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
## 2017-06-12 DIAGNOSIS — M7061 Trochanteric bursitis, right hip: Secondary | ICD-10-CM | POA: Diagnosis not present

## 2017-06-15 DIAGNOSIS — M7061 Trochanteric bursitis, right hip: Secondary | ICD-10-CM | POA: Diagnosis not present

## 2017-06-15 DIAGNOSIS — M62831 Muscle spasm of calf: Secondary | ICD-10-CM | POA: Diagnosis not present

## 2017-06-17 ENCOUNTER — Encounter: Payer: 59 | Admitting: Vascular Surgery

## 2017-07-02 DIAGNOSIS — N83209 Unspecified ovarian cyst, unspecified side: Secondary | ICD-10-CM

## 2017-07-02 HISTORY — DX: Unspecified ovarian cyst, unspecified side: N83.209

## 2017-07-09 DIAGNOSIS — M7061 Trochanteric bursitis, right hip: Secondary | ICD-10-CM | POA: Diagnosis not present

## 2017-07-09 DIAGNOSIS — M62831 Muscle spasm of calf: Secondary | ICD-10-CM | POA: Diagnosis not present

## 2017-07-14 DIAGNOSIS — M62831 Muscle spasm of calf: Secondary | ICD-10-CM | POA: Diagnosis not present

## 2017-07-14 DIAGNOSIS — M7061 Trochanteric bursitis, right hip: Secondary | ICD-10-CM | POA: Diagnosis not present

## 2017-07-16 DIAGNOSIS — R87619 Unspecified abnormal cytological findings in specimens from cervix uteri: Secondary | ICD-10-CM | POA: Diagnosis not present

## 2017-07-16 DIAGNOSIS — N870 Mild cervical dysplasia: Secondary | ICD-10-CM | POA: Diagnosis not present

## 2017-07-21 DIAGNOSIS — N870 Mild cervical dysplasia: Secondary | ICD-10-CM | POA: Insufficient documentation

## 2017-07-22 DIAGNOSIS — M62831 Muscle spasm of calf: Secondary | ICD-10-CM | POA: Diagnosis not present

## 2017-07-22 DIAGNOSIS — M7061 Trochanteric bursitis, right hip: Secondary | ICD-10-CM | POA: Diagnosis not present

## 2017-07-22 DIAGNOSIS — N871 Moderate cervical dysplasia: Secondary | ICD-10-CM | POA: Diagnosis not present

## 2017-07-22 DIAGNOSIS — R102 Pelvic and perineal pain: Secondary | ICD-10-CM | POA: Diagnosis not present

## 2017-08-20 ENCOUNTER — Telehealth: Payer: Self-pay | Admitting: Medical

## 2017-08-20 ENCOUNTER — Ambulatory Visit: Payer: 59 | Admitting: Nurse Practitioner

## 2017-08-20 ENCOUNTER — Other Ambulatory Visit (INDEPENDENT_AMBULATORY_CARE_PROVIDER_SITE_OTHER): Payer: 59

## 2017-08-20 ENCOUNTER — Encounter: Payer: Self-pay | Admitting: Nurse Practitioner

## 2017-08-20 VITALS — BP 122/84 | HR 69 | Temp 98.5°F | Resp 16 | Ht 63.0 in | Wt 150.4 lb

## 2017-08-20 DIAGNOSIS — Z0001 Encounter for general adult medical examination with abnormal findings: Secondary | ICD-10-CM | POA: Diagnosis not present

## 2017-08-20 DIAGNOSIS — Z114 Encounter for screening for human immunodeficiency virus [HIV]: Secondary | ICD-10-CM

## 2017-08-20 DIAGNOSIS — Z9189 Other specified personal risk factors, not elsewhere classified: Secondary | ICD-10-CM | POA: Diagnosis not present

## 2017-08-20 DIAGNOSIS — Z1329 Encounter for screening for other suspected endocrine disorder: Secondary | ICD-10-CM

## 2017-08-20 DIAGNOSIS — N870 Mild cervical dysplasia: Secondary | ICD-10-CM | POA: Diagnosis not present

## 2017-08-20 DIAGNOSIS — Z13220 Encounter for screening for lipoid disorders: Secondary | ICD-10-CM | POA: Diagnosis not present

## 2017-08-20 DIAGNOSIS — Z0000 Encounter for general adult medical examination without abnormal findings: Secondary | ICD-10-CM

## 2017-08-20 DIAGNOSIS — N925 Other specified irregular menstruation: Secondary | ICD-10-CM | POA: Diagnosis not present

## 2017-08-20 DIAGNOSIS — R5381 Other malaise: Secondary | ICD-10-CM | POA: Diagnosis not present

## 2017-08-20 LAB — LIPID PANEL
Cholesterol: 122 mg/dL (ref 0–200)
HDL: 52.9 mg/dL (ref >39.00)
LDL CALC: 57 mg/dL (ref 0–99)
NONHDL: 68.75
Total CHOL/HDL Ratio: 2
Triglycerides: 59 mg/dL (ref 0.0–149.0)
VLDL: 11.8 mg/dL (ref 0.0–40.0)

## 2017-08-20 LAB — TSH: TSH: 1.83 u[IU]/mL (ref 0.35–4.50)
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
## 2017-08-20 NOTE — Telephone Encounter (Signed)
Please get update.  I was surprised to see she established with another practice for physical today.  Not sure if she had concerns about our office.  I will need to be removed as PCP.

## 2017-08-20 NOTE — Patient Instructions (Signed)
Please head downstairs for lab work.  Please work on your diet and exercise as we discussed. Remember half of your plate should be veggies, one-fourth carbs, one-fourth meat, and don't eat meat at every meal. Also, remember to stay away from sugary drinks. I'd like for you to start incorporating exercise into your daily schedule. Start at 10 minutes a day, working up to 30 minutes five times a week.   Ill see you back in 1 year for a complete physical or sooner if you need me.  It was so nice to meet you. Welcome to [facility]!!   Preventive Care 18-39 Years, Female Preventive care refers to lifestyle choices and visits with your health care provider that can promote health and wellness. What does preventive care include?  A yearly physical exam. This is also called an annual well check.  Dental exams once or twice a year.  Routine eye exams. Ask your health care provider how often you should have your eyes checked.  Personal lifestyle choices, including: ? Daily care of your teeth and gums. ? Regular physical activity. ? Eating a healthy diet. ? Avoiding tobacco and drug use. ? Limiting alcohol use. ? Practicing safe sex. ? Taking vitamin and mineral supplements as recommended by your health care provider. What happens during an annual well check? The services and screenings done by your health care provider during your annual well check will depend on your age, overall health, lifestyle risk factors, and family history of disease. Counseling Your health care provider may ask you questions about your:  Alcohol use.  Tobacco use.  Drug use.  Emotional well-being.  Home and relationship well-being.  Sexual activity.  Eating habits.  Work and work Statistician.  Method of birth control.  Menstrual cycle.  Pregnancy history.  Screening You may have the following tests or measurements:  Height, weight, and BMI.  Diabetes screening. This is done by checking your  blood sugar (glucose) after you have not eaten for a while (fasting).  Blood pressure.  Lipid and cholesterol levels. These may be checked every 5 years starting at age 10.  Skin check.  Hepatitis C blood test.  Hepatitis B blood test.  Sexually transmitted disease (STD) testing.  BRCA-related cancer screening. This may be done if you have a family history of breast, ovarian, tubal, or peritoneal cancers.  Pelvic exam and Pap test. This may be done every 3 years starting at age 43. Starting at age 13, this may be done every 5 years if you have a Pap test in combination with an HPV test.  Discuss your test results, treatment options, and if necessary, the need for more tests with your health care provider. Vaccines Your health care provider may recommend certain vaccines, such as:  Influenza vaccine. This is recommended every year.  Tetanus, diphtheria, and acellular pertussis (Tdap, Td) vaccine. You may need a Td booster every 10 years.  Varicella vaccine. You may need this if you have not been vaccinated.  HPV vaccine. If you are 22 or younger, you may need three doses over 6 months.  Measles, mumps, and rubella (MMR) vaccine. You may need at least one dose of MMR. You may also need a second dose.  Pneumococcal 13-valent conjugate (PCV13) vaccine. You may need this if you have certain conditions and were not previously vaccinated.  Pneumococcal polysaccharide (PPSV23) vaccine. You may need one or two doses if you smoke cigarettes or if you have certain conditions.  Meningococcal vaccine. One dose is  recommended if you are age 65-21 years and a first-year college student living in a residence hall, or if you have one of several medical conditions. You may also need additional booster doses.  Hepatitis A vaccine. You may need this if you have certain conditions or if you travel or work in places where you may be exposed to hepatitis A.  Hepatitis B vaccine. You may need this if  you have certain conditions or if you travel or work in places where you may be exposed to hepatitis B.  Haemophilus influenzae type b (Hib) vaccine. You may need this if you have certain risk factors.  Talk to your health care provider about which screenings and vaccines you need and how often you need them. This information is not intended to replace advice given to you by your health care provider. Make sure you discuss any questions you have with your health care provider. Document Released: 11/18/2001 Document Revised: 06/11/2016 Document Reviewed: 07/24/2015 Elsevier Interactive Patient Education  2017 [employer].
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
## 2017-08-21 LAB — HIV ANTIBODY (ROUTINE TESTING W REFLEX): HIV: NONREACTIVE

## 2017-09-03 NOTE — Telephone Encounter (Signed)
Forwarding to you, maybe more appropriate  for you to call and talk to her

## 2017-09-03 NOTE — Telephone Encounter (Signed)
Called pt reached voice mail [email or web address].

## 2017-09-09 DIAGNOSIS — R51 Headache: Secondary | ICD-10-CM | POA: Diagnosis not present

## 2017-11-03 DIAGNOSIS — M25551 Pain in right hip: Secondary | ICD-10-CM | POA: Diagnosis not present

## 2017-11-04 ENCOUNTER — Encounter: Payer: Self-pay | Admitting: Internal Medicine

## 2017-11-04 ENCOUNTER — Ambulatory Visit: Payer: 59 | Admitting: Internal Medicine

## 2017-11-04 VITALS — BP 130/80 | HR 93 | Temp 98.7°F | Resp 16 | Wt 154.0 lb

## 2017-11-04 DIAGNOSIS — R52 Pain, unspecified: Secondary | ICD-10-CM | POA: Diagnosis not present

## 2017-11-04 DIAGNOSIS — B349 Viral infection, unspecified: Secondary | ICD-10-CM | POA: Diagnosis not present

## 2017-11-04 LAB — POCT INFLUENZA A/B
Influenza A, POC: NEGATIVE
Influenza B, POC: NEGATIVE

## 2017-11-04 MED ORDER — OSELTAMIVIR PHOSPHATE 75 MG PO CAPS: 75.0000 mg | ORAL_CAPSULE | Freq: Two times a day (BID) | ORAL | 0 refills | Status: DC
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
## 2017-11-04 NOTE — Patient Instructions (Addendum)
Take the tamiflu as directed.   Treat your symptoms with over the counter cold medications. Increase rest and fluids.  Call if no improvement    Your prescription(s) have been submitted to your pharmacy or been printed and provided for you. Please take as directed and contact our office if you believe you are having problem(s) with the medication(s) or have any questions.  If your symptoms worsen or fail to improve, please contact our office for further instruction, or in case of emergency go directly to the emergency room at the closest medical facility.   General Recommendations:    Please drink plenty of fluids.  Get plenty of rest   Sleep in humidified air  Use saline nasal sprays  Netti pot  OTC Medications:  Decongestants - helps relieve congestion   Flonase (generic fluticasone) or Nasacort (generic triamcinolone) - please make sure to use the "cross-over" technique at a 45 degree angle towards the opposite eye as opposed to straight up the nasal passageway.   Sudafed (generic pseudoephedrine - Note this is the one that is available behind the pharmacy counter); Products with phenylephrine (-PE) may also be used but is often not as effective as pseudoephedrine.   If you have HIGH BLOOD PRESSURE - Coricidin HBP; AVOID any product that is -D as this contains pseudoephedrine which may increase your blood pressure.  Afrin (oxymetazoline) every 6-8 hours for up to 3 days.  Allergies - helps relieve runny nose, itchy eyes and sneezing   Claritin (generic loratidine), Allegra (fexofenidine), or Zyrtec (generic cyrterizine) for runny nose. These medications should not cause drowsiness.  Note - Benadryl (generic diphenhydramine) may be used however may cause drowsiness  Cough -   Delsym or Robitussin (generic dextromethorphan)  Expectorants - helps loosen mucus to ease removal   Mucinex (generic guaifenesin) as directed on the package.  Headaches /  General Aches   Tylenol (generic acetaminophen) - DO NOT EXCEED 3 grams (3,000 mg) in a 24 hour time period  Advil/Motrin (generic ibuprofen)  Sore Throat -   Salt water gargle   Chloraseptic (generic benzocaine) spray or lozenges / Sucrets (generic dyclonine)

## 2017-11-04 NOTE — Assessment & Plan Note (Signed)
Daughter has the flu, her rapid flu test is negative She has a viral illness and I am still suspicious this may be the flu - she needs tamiflu either way and I will prescribe the treatment dose Continue otc cold medications prn Rest, fluids Call if no improvement

## 2017-11-09 DIAGNOSIS — M25551 Pain in right hip: Secondary | ICD-10-CM | POA: Diagnosis not present

## 2017-11-26 DIAGNOSIS — M25551 Pain in right hip: Secondary | ICD-10-CM | POA: Diagnosis not present

## 2017-12-10 DIAGNOSIS — D225 Melanocytic nevi of trunk: Secondary | ICD-10-CM | POA: Diagnosis not present

## 2017-12-10 DIAGNOSIS — L309 Dermatitis, unspecified: Secondary | ICD-10-CM | POA: Diagnosis not present

## 2017-12-15 DIAGNOSIS — M25551 Pain in right hip: Secondary | ICD-10-CM | POA: Diagnosis not present

## 2018-01-12 DIAGNOSIS — M25551 Pain in right hip: Secondary | ICD-10-CM | POA: Diagnosis not present

## 2018-01-21 DIAGNOSIS — Z01419 Encounter for gynecological examination (general) (routine) without abnormal findings: Secondary | ICD-10-CM | POA: Diagnosis not present

## 2018-01-21 DIAGNOSIS — Z124 Encounter for screening for malignant neoplasm of cervix: Secondary | ICD-10-CM | POA: Diagnosis not present

## 2018-01-22 LAB — HM PAP SMEAR: HM PAP: NEGATIVE

## 2018-02-24 ENCOUNTER — Encounter: Payer: Self-pay | Admitting: Nurse Practitioner

## 2018-02-24 ENCOUNTER — Encounter

## 2018-02-24 ENCOUNTER — Ambulatory Visit: Payer: 59 | Admitting: Nurse Practitioner

## 2018-02-24 ENCOUNTER — Other Ambulatory Visit (INDEPENDENT_AMBULATORY_CARE_PROVIDER_SITE_OTHER): Payer: 59

## 2018-02-24 VITALS — BP 110/70 | HR 68 | Temp 98.6°F | Resp 16 | Ht 63.0 in | Wt 155.8 lb

## 2018-02-24 DIAGNOSIS — Z01818 Encounter for other preprocedural examination: Secondary | ICD-10-CM | POA: Diagnosis not present

## 2018-02-24 LAB — COMPREHENSIVE METABOLIC PANEL
ALK PHOS: 70 U/L (ref 39–117)
ALT: 21 U/L (ref 0–35)
AST: 17 U/L (ref 0–37)
Albumin: 4.5 g/dL (ref 3.5–5.2)
BUN: 10 mg/dL (ref 6–23)
CO2: 27 meq/L (ref 19–32)
Calcium: 9.5 mg/dL (ref 8.4–10.5)
Chloride: 104 mEq/L (ref 96–112)
Creatinine, Ser: 0.76 mg/dL (ref 0.40–1.20)
GFR: 90.05 mL/min (ref >60.00)
GLUCOSE: 83 mg/dL (ref 70–99)
POTASSIUM: 4.1 meq/L (ref 3.5–5.1)
SODIUM: 137 meq/L (ref 135–145)
Total Bilirubin: 0.3 mg/dL (ref 0.2–1.2)
Total Protein: 7.5 g/dL (ref 6.0–8.3)

## 2018-02-24 LAB — CBC
HEMATOCRIT: 40 % (ref 36.0–46.0)
HEMOGLOBIN: 13.7 g/dL (ref 12.0–15.0)
MCHC: 34.2 g/dL (ref 30.0–36.0)
MCV: 90.1 fl (ref 78.0–100.0)
Platelets: 231 10*3/uL (ref 150.0–400.0)
RBC: 4.44 Mil/uL (ref 3.87–5.11)
RDW: 13 % (ref 11.5–15.5)
WBC: 6.6 10*3/uL (ref 4.0–10.5)
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
## 2018-02-24 NOTE — Patient Instructions (Signed)
Please head downstairs for lab work/x-rays. If any of your test results are critically abnormal, you will be contacted right away. Your results may be released to your MyChart for viewing before I am able to provide you with my response. I will contact you within a week about your test results and any recommendations for abnormalities.  When your lab work returns, I can proceed with your surgical clearance.  Good luck with surgery. Thanks for letting me take care of you today.

## 2018-03-02 ENCOUNTER — Telehealth: Payer: Self-pay

## 2018-03-02 NOTE — Telephone Encounter (Signed)
Letter has been faxed over to [facility] orthopedic surgery for surgical clearance.

## 2018-03-08 DIAGNOSIS — R262 Difficulty in walking, not elsewhere classified: Secondary | ICD-10-CM | POA: Diagnosis not present

## 2018-03-08 DIAGNOSIS — M25551 Pain in right hip: Secondary | ICD-10-CM | POA: Diagnosis not present

## 2018-03-09 DIAGNOSIS — M25851 Other specified joint disorders, right hip: Secondary | ICD-10-CM | POA: Diagnosis not present

## 2018-03-09 DIAGNOSIS — S73191A Other sprain of right hip, initial encounter: Secondary | ICD-10-CM | POA: Diagnosis not present

## 2018-03-09 DIAGNOSIS — M25551 Pain in right hip: Secondary | ICD-10-CM | POA: Diagnosis not present

## 2018-03-09 DIAGNOSIS — M94251 Chondromalacia, right hip: Secondary | ICD-10-CM | POA: Diagnosis not present

## 2018-03-11 DIAGNOSIS — M25551 Pain in right hip: Secondary | ICD-10-CM | POA: Diagnosis not present

## 2018-03-11 DIAGNOSIS — R262 Difficulty in walking, not elsewhere classified: Secondary | ICD-10-CM | POA: Diagnosis not present

## 2018-03-11 DIAGNOSIS — M25651 Stiffness of right hip, not elsewhere classified: Secondary | ICD-10-CM | POA: Diagnosis not present

## 2018-03-17 DIAGNOSIS — M25651 Stiffness of right hip, not elsewhere classified: Secondary | ICD-10-CM | POA: Diagnosis not present

## 2018-03-17 DIAGNOSIS — R29898 Other symptoms and signs involving the musculoskeletal system: Secondary | ICD-10-CM | POA: Diagnosis not present

## 2018-03-17 DIAGNOSIS — R262 Difficulty in walking, not elsewhere classified: Secondary | ICD-10-CM | POA: Diagnosis not present

## 2018-03-25 DIAGNOSIS — R262 Difficulty in walking, not elsewhere classified: Secondary | ICD-10-CM | POA: Diagnosis not present

## 2018-03-25 DIAGNOSIS — M25651 Stiffness of right hip, not elsewhere classified: Secondary | ICD-10-CM | POA: Diagnosis not present

## 2018-03-25 DIAGNOSIS — R29898 Other symptoms and signs involving the musculoskeletal system: Secondary | ICD-10-CM | POA: Diagnosis not present

## 2018-04-01 DIAGNOSIS — R262 Difficulty in walking, not elsewhere classified: Secondary | ICD-10-CM | POA: Diagnosis not present

## 2018-04-01 DIAGNOSIS — R29898 Other symptoms and signs involving the musculoskeletal system: Secondary | ICD-10-CM | POA: Diagnosis not present

## 2018-04-01 DIAGNOSIS — M25651 Stiffness of right hip, not elsewhere classified: Secondary | ICD-10-CM | POA: Diagnosis not present

## 2018-04-05 DIAGNOSIS — M25651 Stiffness of right hip, not elsewhere classified: Secondary | ICD-10-CM | POA: Diagnosis not present

## 2018-04-05 DIAGNOSIS — R29898 Other symptoms and signs involving the musculoskeletal system: Secondary | ICD-10-CM | POA: Diagnosis not present

## 2018-04-05 DIAGNOSIS — R262 Difficulty in walking, not elsewhere classified: Secondary | ICD-10-CM | POA: Diagnosis not present

## 2018-04-13 DIAGNOSIS — M25651 Stiffness of right hip, not elsewhere classified: Secondary | ICD-10-CM | POA: Diagnosis not present

## 2018-04-13 DIAGNOSIS — Z9889 Other specified postprocedural states: Secondary | ICD-10-CM | POA: Diagnosis not present

## 2018-04-13 DIAGNOSIS — M25551 Pain in right hip: Secondary | ICD-10-CM | POA: Diagnosis not present

## 2018-04-13 DIAGNOSIS — R262 Difficulty in walking, not elsewhere classified: Secondary | ICD-10-CM | POA: Diagnosis not present

## 2018-04-13 DIAGNOSIS — Z4789 Encounter for other orthopedic aftercare: Secondary | ICD-10-CM | POA: Diagnosis not present

## 2018-04-13 DIAGNOSIS — R29898 Other symptoms and signs involving the musculoskeletal system: Secondary | ICD-10-CM | POA: Diagnosis not present
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
## 2018-04-20 DIAGNOSIS — R29898 Other symptoms and signs involving the musculoskeletal system: Secondary | ICD-10-CM | POA: Diagnosis not present

## 2018-04-20 DIAGNOSIS — R262 Difficulty in walking, not elsewhere classified: Secondary | ICD-10-CM | POA: Diagnosis not present

## 2018-04-20 DIAGNOSIS — M25651 Stiffness of right hip, not elsewhere classified: Secondary | ICD-10-CM | POA: Diagnosis not present

## 2018-06-04 DIAGNOSIS — Z9889 Other specified postprocedural states: Secondary | ICD-10-CM | POA: Diagnosis not present

## 2018-06-04 DIAGNOSIS — R29898 Other symptoms and signs involving the musculoskeletal system: Secondary | ICD-10-CM | POA: Diagnosis not present

## 2018-06-04 DIAGNOSIS — S73191S Other sprain of right hip, sequela: Secondary | ICD-10-CM | POA: Diagnosis not present

## 2018-07-13 DIAGNOSIS — Z96641 Presence of right artificial hip joint: Secondary | ICD-10-CM | POA: Diagnosis not present

## 2018-07-13 DIAGNOSIS — M25551 Pain in right hip: Secondary | ICD-10-CM | POA: Diagnosis not present

## 2018-08-05 DIAGNOSIS — L309 Dermatitis, unspecified: Secondary | ICD-10-CM | POA: Diagnosis not present

## 2018-08-23 ENCOUNTER — Ambulatory Visit (INDEPENDENT_AMBULATORY_CARE_PROVIDER_SITE_OTHER): Payer: 59 | Admitting: Nurse Practitioner

## 2018-08-23 ENCOUNTER — Encounter: Payer: Self-pay | Admitting: Nurse Practitioner

## 2018-08-23 ENCOUNTER — Other Ambulatory Visit (INDEPENDENT_AMBULATORY_CARE_PROVIDER_SITE_OTHER): Payer: 59

## 2018-08-23 VITALS — BP 110/70 | HR 74 | Ht 63.0 in | Wt 151.0 lb

## 2018-08-23 DIAGNOSIS — R21 Rash and other nonspecific skin eruption: Secondary | ICD-10-CM

## 2018-08-23 DIAGNOSIS — Z0001 Encounter for general adult medical examination with abnormal findings: Secondary | ICD-10-CM | POA: Diagnosis not present

## 2018-08-23 DIAGNOSIS — R519 Headache, unspecified: Secondary | ICD-10-CM

## 2018-08-23 DIAGNOSIS — Z23 Encounter for immunization: Secondary | ICD-10-CM

## 2018-08-23 DIAGNOSIS — E663 Overweight: Secondary | ICD-10-CM | POA: Diagnosis not present

## 2018-08-23 DIAGNOSIS — Z13220 Encounter for screening for lipoid disorders: Secondary | ICD-10-CM | POA: Diagnosis not present

## 2018-08-23 DIAGNOSIS — R51 Headache: Secondary | ICD-10-CM

## 2018-08-23 LAB — LIPID PANEL
CHOLESTEROL: 124 mg/dL (ref 0–200)
HDL: 52.7 mg/dL (ref >39.00)
LDL Cholesterol: 55 mg/dL (ref 0–99)
NonHDL: 71
TRIGLYCERIDES: 81 mg/dL (ref 0.0–149.0)
Total CHOL/HDL Ratio: 2
VLDL: 16.2 mg/dL (ref 0.0–40.0)

## 2018-08-23 LAB — COMPREHENSIVE METABOLIC PANEL
ALT: 13 U/L (ref 0–35)
AST: 15 U/L (ref 0–37)
Albumin: 4.5 g/dL (ref 3.5–5.2)
Alkaline Phosphatase: 58 U/L (ref 39–117)
BILIRUBIN TOTAL: 0.4 mg/dL (ref 0.2–1.2)
BUN: 12 mg/dL (ref 6–23)
CALCIUM: 9 mg/dL (ref 8.4–10.5)
CO2: 24 meq/L (ref 19–32)
CREATININE: 0.74 mg/dL (ref 0.40–1.20)
Chloride: 107 mEq/L (ref 96–112)
GFR: 92.63 mL/min (ref >60.00)
GLUCOSE: 94 mg/dL (ref 70–99)
Potassium: 4.2 mEq/L (ref 3.5–5.1)
Sodium: 138 mEq/L (ref 135–145)
TOTAL PROTEIN: 7.1 g/dL (ref 6.0–8.3)

## 2018-08-23 LAB — CBC
HCT: 39.3 % (ref 36.0–46.0)
Hemoglobin: 13.5 g/dL (ref 12.0–15.0)
MCHC: 34.3 g/dL (ref 30.0–36.0)
MCV: 89.3 fl (ref 78.0–100.0)
Platelets: 198 10*3/uL (ref 150.0–400.0)
RBC: 4.41 Mil/uL (ref 3.87–5.11)
RDW: 12.5 % (ref 11.5–15.5)
WBC: 6.7 10*3/uL (ref 4.0–10.5)

## 2018-08-23 LAB — C-REACTIVE PROTEIN: CRP: 0.4 mg/dL — AB (ref 0.5–20.0)

## 2018-08-23 LAB — SEDIMENTATION RATE: SED RATE: 3 mm/h (ref 0–20)

## 2018-08-23 MED ORDER — RIZATRIPTAN BENZOATE 5 MG PO TBDP: 5.0000 mg | ORAL_TABLET | ORAL | 0 refills | Status: DC | PRN

## 2018-08-23 NOTE — Patient Instructions (Addendum)
Labs downstairs, you will be contacted to schedule CT  I have sent a new prescription to your pharmacy for maxalt 49m melting tablets. This is a medication for migraines. You should take 1 tablet at the onset of your migraine. You can repeat this dose in 2 hours if no improvement. Do not take more than 389mof maxalt (3 tablets) in a 24 hour period.  I will see you in 1 month.   General Headache Without Cause A headache is pain or discomfort felt around the head or neck area. There are many causes and types of headaches. In some cases, the cause may not be found. Follow these instructions at home: Managing pain  Take over-the-counter and prescription medicines only as told by your doctor.  Lie down in a dark, quiet room when you have a headache.  If directed, apply ice to the head and neck area: ? Put ice in a plastic bag. ? Place a towel between your skin and the bag. ? Leave the ice on for 20 minutes, 2-3 times per day.  Use a heating pad or hot shower to apply heat to the head and neck area as told by your doctor.  Keep lights dim if bright lights bother you or make your headaches worse. Eating and drinking  Eat meals on a regular schedule.  Lessen how much alcohol you drink.  Lessen how much caffeine you drink, or stop drinking caffeine. General instructions  Keep all follow-up visits as told by your doctor. This is important.  Keep a journal to find out if certain things bring on headaches. For example, write down: ? What you eat and drink. ? How much sleep you get. ? Any change to your diet or medicines.  Relax by getting a massage or doing other relaxing activities.  Lessen stress.  Sit up straight. Do not tighten (tense) your muscles.  Do not use tobacco products. This includes cigarettes, chewing tobacco, or e-cigarettes. If you need help quitting, ask your doctor.  Exercise regularly as told by your doctor.  Get enough sleep. This often means 7-9 hours  of sleep. Contact a doctor if:  Your symptoms are not helped by medicine.  You have a headache that feels different than the other headaches.  You feel sick to your stomach (nauseous) or you throw up (vomit).  You have a fever. Get help right away if:  Your headache becomes really bad.  You keep throwing up.  You have a stiff neck.  You have trouble seeing.  You have trouble speaking.  You have pain in the eye or ear.  Your muscles are weak or you lose muscle control.  You lose your balance or have trouble walking.  You feel like you will pass out (faint) or you pass out.  You have confusion. This information is not intended to replace advice given to you by your health care provider. Make sure you discuss any questions you have with your health care provider. Document Released: 07/01/2008 Document Revised: 02/28/2016 Document Reviewed: 01/15/2015 Elsevier Interactive Patient Education  2018 [place] Adopting a healthy lifestyle and getting preventive care can go a long way to promote health and wellness. Talk with your health care provider about what schedule of regular examinations is right for you. This is a good chance for you to check in with your provider about disease prevention and staying healthy. In between checkups, there are plenty of things you can do on your own.  Experts have done a lot of research about which lifestyle changes and preventive measures are most likely to keep you healthy. Ask your health care provider for more information. Weight and diet Eat a healthy diet  Be sure to include plenty of vegetables, fruits, low-fat dairy products, and lean protein.  Do not eat a lot of foods high in solid fats, added sugars, or salt.  Get regular exercise. This is one of the most important things you can do for your health. ? Most adults should exercise for at least 150 minutes each week. The exercise should increase your  heart rate and make you sweat (moderate-intensity exercise). ? Most adults should also do strengthening exercises at least twice a week. This is in addition to the moderate-intensity exercise.  Maintain a healthy weight  Body mass index (BMI) is a measurement that can be used to identify possible weight problems. It estimates body fat based on height and weight. Your health care provider can help determine your BMI and help you achieve or maintain a healthy weight.  For females 42 years of age and older: ? A BMI below 18.5 is considered underweight. ? A BMI of 18.5 to 24.9 is normal. ? A BMI of 25 to 29.9 is considered overweight. ? A BMI of 30 and above is considered obese.  Watch levels of cholesterol and blood lipids  You should start having your blood tested for lipids and cholesterol at 39 years of age, then have this test every 5 years.  You may need to have your cholesterol levels checked more often if: ? Your lipid or cholesterol levels are high. ? You are older than 39 years of age. ? You are at high risk for heart disease.  Cancer screening Lung Cancer  Lung cancer screening is recommended for adults 7-45 years old who are at high risk for lung cancer because of a history of smoking.  A yearly low-dose CT scan of the lungs is recommended for people who: ? Currently smoke. ? Have quit within the past 15 years. ? Have at least a 30-pack-year history of smoking. A pack year is smoking an average of one pack of cigarettes a day for 1 year.  Yearly screening should continue until it has been 15 years since you quit.  Yearly screening should stop if you develop a health problem that would prevent you from having lung cancer treatment.  Breast Cancer  Practice breast self-awareness. This means understanding how your breasts normally appear and feel.  It also means doing regular breast self-exams. Let your health care provider know about any changes, no matter how  small.  If you are in your 20s or 30s, you should have a clinical breast exam (CBE) by a health care provider every 1-3 years as part of a regular health exam.  If you are 41 or older, have a CBE every year. Also consider having a breast X-ray (mammogram) every year.  If you have a family history of breast cancer, talk to your health care provider about genetic screening.  If you are at high risk for breast cancer, talk to your health care provider about having an MRI and a mammogram every year.  Breast cancer gene (BRCA) assessment is recommended for women who have family members with BRCA-related cancers. BRCA-related cancers include: ? Breast. ? Ovarian. ? Tubal. ? Peritoneal cancers.  Results of the assessment will determine the need for genetic counseling and BRCA1 and BRCA2 testing.  Cervical Cancer Your health  care provider may recommend that you be screened regularly for cancer of the pelvic organs (ovaries, uterus, and vagina). This screening involves a pelvic examination, including checking for microscopic changes to the surface of your cervix (Pap test). You may be encouraged to have this screening done every 3 years, beginning at age 50.  For women ages 13-65, health care providers may recommend pelvic exams and Pap testing every 3 years, or they may recommend the Pap and pelvic exam, combined with testing for human papilloma virus (HPV), every 5 years. Some types of HPV increase your risk of cervical cancer. Testing for HPV may also be done on women of any age with unclear Pap test results.  Other health care providers may not recommend any screening for nonpregnant women who are considered low risk for pelvic cancer and who do not have symptoms. Ask your health care provider if a screening pelvic exam is right for you.  If you have had past treatment for cervical cancer or a condition that could lead to cancer, you need Pap tests and screening for cancer for at least 20 years  after your treatment. If Pap tests have been discontinued, your risk factors (such as having a new sexual partner) need to be reassessed to determine if screening should resume. Some women have medical problems that increase the chance of getting cervical cancer. In these cases, your health care provider may recommend more frequent screening and Pap tests.  Colorectal Cancer  This type of cancer can be detected and often prevented.  Routine colorectal cancer screening usually begins at 39 years of age and continues through 39 years of age.  Your health care provider may recommend screening at an earlier age if you have risk factors for colon cancer.  Your health care provider may also recommend using home test kits to check for hidden blood in the stool.  A small camera at the end of a tube can be used to examine your colon directly (sigmoidoscopy or colonoscopy). This is done to check for the earliest forms of colorectal cancer.  Routine screening usually begins at age 17.  Direct examination of the colon should be repeated every 5-10 years through 39 years of age. However, you may need to be screened more often if early forms of precancerous polyps or small growths are found.  Skin Cancer  Check your skin from head to toe regularly.  Tell your health care provider about any new moles or changes in moles, especially if there is a change in a mole's shape or color.  Also tell your health care provider if you have a mole that is larger than the size of a pencil eraser.  Always use sunscreen. Apply sunscreen liberally and repeatedly throughout the day.  Protect yourself by wearing long sleeves, pants, a wide-brimmed hat, and sunglasses whenever you are outside.  Heart disease, diabetes, and high blood pressure  High blood pressure causes heart disease and increases the risk of stroke. High blood pressure is more likely to develop in: ? People who have blood pressure in the high end of  the normal range (130-139/85-89 mm Hg). ? People who are overweight or obese. ? People who are African American.  If you are 33-41 years of age, have your blood pressure checked every 3-5 years. If you are 52 years of age or older, have your blood pressure checked every year. You should have your blood pressure measured twice-once when you are at a hospital or clinic, and once  when you are not at a hospital or clinic. Record the average of the two measurements. To check your blood pressure when you are not at a hospital or clinic, you can use: ? An automated blood pressure machine at a pharmacy. ? A home blood pressure monitor.  If you are between 46 years and 55 years old, ask your health care provider if you should take aspirin to prevent strokes.  Have regular diabetes screenings. This involves taking a blood sample to check your fasting blood sugar level. ? If you are at a normal weight and have a low risk for diabetes, have this test once every three years after 39 years of age. ? If you are overweight and have a high risk for diabetes, consider being tested at a younger age or more often. Preventing infection Hepatitis B  If you have a higher risk for hepatitis B, you should be screened for this virus. You are considered at high risk for hepatitis B if: ? You were born in a country where hepatitis B is common. Ask your health care provider which countries are considered high risk. ? Your parents were born in a high-risk country, and you have not been immunized against hepatitis B (hepatitis B vaccine). ? You have HIV or AIDS. ? You use needles to inject street drugs. ? You live with someone who has hepatitis B. ? You have had sex with someone who has hepatitis B. ? You get hemodialysis treatment. ? You take certain medicines for conditions, including cancer, organ transplantation, and autoimmune conditions.  Hepatitis C  Blood testing is recommended for: ? Everyone born from [birth year]  through 1965. ? Anyone with known risk factors for hepatitis C.  Sexually transmitted infections (STIs)  You should be screened for sexually transmitted infections (STIs) including gonorrhea and chlamydia if: ? You are sexually active and are younger than 39 years of age. ? You are older than 39 years of age and your health care provider tells you that you are at risk for this type of infection. ? Your sexual activity has changed since you were last screened and you are at an increased risk for chlamydia or gonorrhea. Ask your health care provider if you are at risk.  If you do not have HIV, but are at risk, it may be recommended that you take a prescription medicine daily to prevent HIV infection. This is called pre-exposure prophylaxis (PrEP). You are considered at risk if: ? You are sexually active and do not regularly use condoms or know the HIV status of your partner(s). ? You take drugs by injection. ? You are sexually active with a partner who has HIV.  Talk with your health care provider about whether you are at high risk of being infected with HIV. If you choose to begin PrEP, you should first be tested for HIV. You should then be tested every 3 months for as long as you are taking PrEP. Pregnancy  If you are premenopausal and you may become pregnant, ask your health care provider about preconception counseling.  If you may become pregnant, take 400 to 800 micrograms (mcg) of folic acid every day.  If you want to prevent pregnancy, talk to your health care provider about birth control (contraception). Osteoporosis and menopause  Osteoporosis is a disease in which the bones lose minerals and strength with aging. This can result in serious bone fractures. Your risk for osteoporosis can be identified using a bone density scan.  If you are  94 years of age or older, or if you are at risk for osteoporosis and fractures, ask your health care provider if you should be screened.  Ask  your health care provider whether you should take a calcium or vitamin D supplement to lower your risk for osteoporosis.  Menopause may have certain physical symptoms and risks.  Hormone replacement therapy may reduce some of these symptoms and risks. Talk to your health care provider about whether hormone replacement therapy is right for you. Follow these instructions at home:  Schedule regular health, dental, and eye exams.  Stay current with your immunizations.  Do not use any tobacco products including cigarettes, chewing tobacco, or electronic cigarettes.  If you are pregnant, do not drink alcohol.  If you are breastfeeding, limit how much and how often you drink alcohol.  Limit alcohol intake to no more than 1 drink per day for nonpregnant women. One drink equals 12 ounces of beer, 5 ounces of wine, or 1 ounces of hard liquor.  Do not use street drugs.  Do not share needles.  Ask your health care provider for help if you need support or information about quitting drugs.  Tell your health care provider if you often feel depressed.  Tell your health care provider if you have ever been abused or do not feel safe at home. This information is not intended to replace advice given to you by your health care provider. Make sure you discuss any questions you have with your health care provider. Document Released: 04/07/2011 Document Revised: 02/28/2016 Document Reviewed: 06/26/2015 Elsevier Interactive Patient Education  [employer].

## 2018-08-23 NOTE — Assessment & Plan Note (Addendum)
Reviewed annual screening exams, healthy lifestyle, weight loss, additional information provided on AVS She would like to see nutritionist to work on diet-referral placed Records request sent to GYN to update HM care gaps in chart - Lipid panel; Future - Ambulatory referral to Nutrition and Diabetic Education - CBC; Future - Comprehensive metabolic panel; Future  Screening for cholesterol level She is fasting - Lipid panel; Future  Overweight (BMI 25.0-29.9) - Ambulatory referral to Nutrition and Diabetic Education
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
## 2018-08-24 ENCOUNTER — Encounter: Payer: Self-pay | Admitting: Nurse Practitioner

## 2018-08-24 LAB — PROLACTIN: PROLACTIN: 12.5 ng/mL

## 2018-08-30 ENCOUNTER — Ambulatory Visit (INDEPENDENT_AMBULATORY_CARE_PROVIDER_SITE_OTHER)
Admission: RE | Admit: 2018-08-30 | Discharge: 2018-08-30 | Disposition: A | Discharge status: Home / Self Care | Payer: 59 | Source: Ambulatory Visit | Attending: Nurse Practitioner | Admitting: Nurse Practitioner

## 2018-08-30 DIAGNOSIS — R51 Headache: Secondary | ICD-10-CM

## 2018-08-30 DIAGNOSIS — R519 Headache, unspecified: Secondary | ICD-10-CM

## 2018-09-23 ENCOUNTER — Ambulatory Visit: Payer: 59 | Admitting: Dietician

## 2018-09-24 ENCOUNTER — Ambulatory Visit: Payer: 59 | Admitting: Nurse Practitioner

## 2018-10-05 ENCOUNTER — Encounter: Payer: Self-pay | Admitting: Allergy and Immunology

## 2018-10-05 ENCOUNTER — Ambulatory Visit: Payer: 59 | Admitting: Allergy and Immunology

## 2018-10-05 VITALS — BP 104/84 | HR 61 | Resp 16 | Ht 63.0 in | Wt 149.0 lb

## 2018-10-05 DIAGNOSIS — G43909 Migraine, unspecified, not intractable, without status migrainosus: Secondary | ICD-10-CM

## 2018-10-05 DIAGNOSIS — T7800XD Anaphylactic reaction due to unspecified food, subsequent encounter: Secondary | ICD-10-CM

## 2018-10-05 MED ORDER — EPINEPHRINE 0.3 MG/0.3ML IJ SOAJ: 0.3000 mg | Freq: Once | INTRAMUSCULAR | 2 refills | Status: AC

## 2018-10-05 NOTE — Patient Instructions (Addendum)
  1.  Allergen avoidance measures  2.  Auvi-Q 0.3, Benadryl, MD/ER evaluation for allergic reaction  3.  Blood -milk panel, alpha gal panel  4.  Further evaluation?  Yes, if recurrent reactions  5.  Consider consolidating caffeine to address headache issue
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
## 2018-10-07 ENCOUNTER — Encounter: Payer: Self-pay | Admitting: Allergy and Immunology

## 2018-10-11 LAB — ALPHA-GAL PANEL
Alpha Gal IgE*: <0.1 kU/L (ref <0.10)
Beef (Bos spp) IgE: <0.1 kU/L (ref <0.35)
Class Interpretation: 0
Class Interpretation: 0
Class Interpretation: 0
Lamb/Mutton (Ovis spp) IgE: <0.1 kU/L (ref <0.35)
Pork (Sus spp) IgE: <0.1 kU/L (ref <0.35)

## 2018-10-11 LAB — MILK COMPONENT PANEL
F077-IGE BETA LACTOGLOBULIN: 0.13 kU/L — AB
F078-IGE CASEIN: 0.91 kU/L — AB

## 2018-10-15 ENCOUNTER — Ambulatory Visit: Payer: 59 | Admitting: Nurse Practitioner

## 2018-10-21 ENCOUNTER — Ambulatory Visit: Payer: BLUE CROSS/BLUE SHIELD | Admitting: Nurse Practitioner

## 2018-10-21 ENCOUNTER — Encounter: Payer: Self-pay | Admitting: Nurse Practitioner

## 2018-10-21 ENCOUNTER — Ambulatory Visit: Payer: 59 | Admitting: Dietician

## 2018-10-21 ENCOUNTER — Other Ambulatory Visit (INDEPENDENT_AMBULATORY_CARE_PROVIDER_SITE_OTHER): Payer: BLUE CROSS/BLUE SHIELD

## 2018-10-21 VITALS — BP 120/88 | HR 71 | Ht 63.0 in | Wt 147.0 lb

## 2018-10-21 DIAGNOSIS — R52 Pain, unspecified: Secondary | ICD-10-CM

## 2018-10-21 DIAGNOSIS — R202 Paresthesia of skin: Secondary | ICD-10-CM

## 2018-10-21 DIAGNOSIS — R519 Headache, unspecified: Secondary | ICD-10-CM

## 2018-10-21 DIAGNOSIS — R51 Headache: Secondary | ICD-10-CM

## 2018-10-21 LAB — TSH: TSH: 2.12 u[IU]/mL (ref 0.35–4.50)

## 2018-10-21 LAB — VITAMIN B12: VITAMIN B 12: 157 pg/mL — AB (ref 211–911)

## 2018-10-21 LAB — VITAMIN D 25 HYDROXY (VIT D DEFICIENCY, FRACTURES): VITD: 18.12 ng/mL — ABNORMAL LOW (ref 30.00–100.00)

## 2018-10-21 NOTE — Patient Instructions (Addendum)
Head downstairs for labs  I will let you know when to follow up when I get your labs back
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
## 2018-10-22 LAB — ANA: ANA: NEGATIVE

## 2018-10-29 ENCOUNTER — Other Ambulatory Visit: Payer: Self-pay | Admitting: Nurse Practitioner

## 2018-10-29 DIAGNOSIS — R52 Pain, unspecified: Secondary | ICD-10-CM

## 2018-10-29 DIAGNOSIS — E559 Vitamin D deficiency, unspecified: Secondary | ICD-10-CM

## 2018-10-29 DIAGNOSIS — E538 Deficiency of other specified B group vitamins: Secondary | ICD-10-CM

## 2018-10-29 MED ORDER — VITAMIN D (ERGOCALCIFEROL) 1.25 MG (50000 UNIT) PO CAPS: 50000.0000 [IU] | ORAL_CAPSULE | ORAL | 0 refills | Status: DC

## 2018-11-01 ENCOUNTER — Ambulatory Visit (INDEPENDENT_AMBULATORY_CARE_PROVIDER_SITE_OTHER): Payer: BLUE CROSS/BLUE SHIELD

## 2018-11-01 DIAGNOSIS — E538 Deficiency of other specified B group vitamins: Secondary | ICD-10-CM | POA: Diagnosis not present

## 2018-11-01 MED ORDER — CYANOCOBALAMIN 1000 MCG/ML IJ SOLN
1000.0000 ug | Freq: Once | INTRAMUSCULAR | Status: AC
  Administered 2018-11-01: 1000 ug via INTRAMUSCULAR

## 2018-11-01 NOTE — Progress Notes (Signed)
I have reviewed administration record and agree with treatment.  

## 2018-11-08 ENCOUNTER — Ambulatory Visit (INDEPENDENT_AMBULATORY_CARE_PROVIDER_SITE_OTHER): Payer: BLUE CROSS/BLUE SHIELD

## 2018-11-08 DIAGNOSIS — E538 Deficiency of other specified B group vitamins: Secondary | ICD-10-CM

## 2018-11-08 MED ORDER — CYANOCOBALAMIN 1000 MCG/ML IJ SOLN
1000.0000 ug | Freq: Once | INTRAMUSCULAR | Status: AC
  Administered 2018-11-08: 1000 ug via INTRAMUSCULAR

## 2018-11-08 NOTE — Progress Notes (Signed)
I have reviewed administration record and agree with treatment.  

## 2018-11-15 ENCOUNTER — Ambulatory Visit (INDEPENDENT_AMBULATORY_CARE_PROVIDER_SITE_OTHER): Payer: BLUE CROSS/BLUE SHIELD

## 2018-11-15 DIAGNOSIS — E538 Deficiency of other specified B group vitamins: Secondary | ICD-10-CM | POA: Diagnosis not present

## 2018-11-15 MED ORDER — CYANOCOBALAMIN 1000 MCG/ML IJ SOLN
1000.0000 ug | Freq: Once | INTRAMUSCULAR | Status: AC
  Administered 2018-11-15: 1000 ug via INTRAMUSCULAR

## 2018-11-15 NOTE — Progress Notes (Signed)
I have reviewed administration record and agree with treatment.  
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
## 2018-11-24 ENCOUNTER — Ambulatory Visit (INDEPENDENT_AMBULATORY_CARE_PROVIDER_SITE_OTHER): Payer: BLUE CROSS/BLUE SHIELD

## 2018-11-24 DIAGNOSIS — E538 Deficiency of other specified B group vitamins: Secondary | ICD-10-CM

## 2018-11-24 MED ORDER — CYANOCOBALAMIN 1000 MCG/ML IJ SOLN
1000.0000 ug | Freq: Once | INTRAMUSCULAR | Status: AC
  Administered 2018-11-24: 1000 ug via INTRAMUSCULAR

## 2018-11-24 NOTE — Progress Notes (Signed)
I have reviewed administration record and agree with treatment.  

## 2018-12-02 ENCOUNTER — Other Ambulatory Visit (INDEPENDENT_AMBULATORY_CARE_PROVIDER_SITE_OTHER): Payer: BLUE CROSS/BLUE SHIELD

## 2018-12-02 DIAGNOSIS — E538 Deficiency of other specified B group vitamins: Secondary | ICD-10-CM

## 2018-12-02 DIAGNOSIS — R52 Pain, unspecified: Secondary | ICD-10-CM

## 2018-12-02 LAB — VITAMIN B12: Vitamin B-12: 283 pg/mL (ref 211–911)

## 2018-12-03 LAB — RHEUMATOID FACTOR: Rheumatoid fact SerPl-aCnc: <14 IU/mL (ref <14)

## 2018-12-06 DIAGNOSIS — Z0279 Encounter for issue of other medical certificate: Secondary | ICD-10-CM

## 2018-12-10 ENCOUNTER — Other Ambulatory Visit: Payer: Self-pay | Admitting: Nurse Practitioner

## 2018-12-10 DIAGNOSIS — E538 Deficiency of other specified B group vitamins: Secondary | ICD-10-CM

## 2018-12-10 DIAGNOSIS — E559 Vitamin D deficiency, unspecified: Secondary | ICD-10-CM

## 2018-12-14 ENCOUNTER — Encounter: Payer: Self-pay | Admitting: *Deleted

## 2018-12-14 ENCOUNTER — Ambulatory Visit (INDEPENDENT_AMBULATORY_CARE_PROVIDER_SITE_OTHER): Payer: BLUE CROSS/BLUE SHIELD | Admitting: *Deleted

## 2018-12-14 DIAGNOSIS — E538 Deficiency of other specified B group vitamins: Secondary | ICD-10-CM | POA: Diagnosis not present

## 2018-12-14 MED ORDER — CYANOCOBALAMIN 1000 MCG/ML IJ SOLN
1000.0000 ug | Freq: Once | INTRAMUSCULAR | Status: AC
  Administered 2018-12-14: 1000 ug via INTRAMUSCULAR

## 2018-12-20 ENCOUNTER — Ambulatory Visit: Payer: BLUE CROSS/BLUE SHIELD

## 2018-12-21 ENCOUNTER — Ambulatory Visit: Payer: BLUE CROSS/BLUE SHIELD

## 2018-12-22 NOTE — Progress Notes (Signed)
I have reviewed administration record and agree with treatment.  

## 2018-12-23 ENCOUNTER — Ambulatory Visit (INDEPENDENT_AMBULATORY_CARE_PROVIDER_SITE_OTHER): Payer: BLUE CROSS/BLUE SHIELD | Admitting: *Deleted

## 2018-12-23 ENCOUNTER — Other Ambulatory Visit: Payer: Self-pay

## 2018-12-23 DIAGNOSIS — E538 Deficiency of other specified B group vitamins: Secondary | ICD-10-CM | POA: Diagnosis not present

## 2018-12-23 MED ORDER — CYANOCOBALAMIN 1000 MCG/ML IJ SOLN
1000.0000 ug | Freq: Once | INTRAMUSCULAR | Status: AC
  Administered 2018-12-23: 1000 ug via INTRAMUSCULAR

## 2018-12-23 NOTE — Progress Notes (Signed)
Pls Co-sign for B12.Marland KitchenJohny Chess

## 2018-12-28 ENCOUNTER — Ambulatory Visit: Payer: BLUE CROSS/BLUE SHIELD

## 2019-01-04 ENCOUNTER — Ambulatory Visit: Payer: BLUE CROSS/BLUE SHIELD

## 2019-03-23 ENCOUNTER — Encounter: Payer: Self-pay | Admitting: Family

## 2019-03-23 ENCOUNTER — Ambulatory Visit (INDEPENDENT_AMBULATORY_CARE_PROVIDER_SITE_OTHER): Payer: BC Managed Care – PPO | Admitting: Family

## 2019-03-23 DIAGNOSIS — M62838 Other muscle spasm: Secondary | ICD-10-CM

## 2019-03-23 DIAGNOSIS — M542 Cervicalgia: Secondary | ICD-10-CM

## 2019-03-23 DIAGNOSIS — M79621 Pain in right upper arm: Secondary | ICD-10-CM | POA: Diagnosis not present

## 2019-03-23 MED ORDER — METHOCARBAMOL 500 MG PO TABS: 500.0000 mg | ORAL_TABLET | Freq: Three times a day (TID) | ORAL | 0 refills | Status: DC | PRN

## 2019-03-23 MED ORDER — METHYLPREDNISOLONE 4 MG PO TBPK: ORAL_TABLET | ORAL | 0 refills | Status: DC
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
## 2019-03-25 ENCOUNTER — Other Ambulatory Visit: Payer: Self-pay

## 2019-03-25 ENCOUNTER — Ambulatory Visit (INDEPENDENT_AMBULATORY_CARE_PROVIDER_SITE_OTHER): Payer: BC Managed Care – PPO

## 2019-03-25 DIAGNOSIS — E538 Deficiency of other specified B group vitamins: Secondary | ICD-10-CM

## 2019-03-25 MED ORDER — CYANOCOBALAMIN 1000 MCG/ML IJ SOLN
1000.0000 ug | Freq: Once | INTRAMUSCULAR | Status: AC
  Administered 2019-03-25: 1000 ug via INTRAMUSCULAR

## 2019-03-25 NOTE — Progress Notes (Signed)
Agree with treatment 

## 2019-03-29 ENCOUNTER — Ambulatory Visit: Payer: Self-pay | Admitting: Allergy and Immunology

## 2019-04-01 ENCOUNTER — Ambulatory Visit (INDEPENDENT_AMBULATORY_CARE_PROVIDER_SITE_OTHER): Payer: BC Managed Care – PPO

## 2019-04-01 ENCOUNTER — Telehealth: Payer: Self-pay | Admitting: Family

## 2019-04-01 DIAGNOSIS — E538 Deficiency of other specified B group vitamins: Secondary | ICD-10-CM

## 2019-04-01 DIAGNOSIS — E559 Vitamin D deficiency, unspecified: Secondary | ICD-10-CM

## 2019-04-01 MED ORDER — CYANOCOBALAMIN 1000 MCG/ML IJ SOLN
1000.0000 ug | Freq: Once | INTRAMUSCULAR | Status: AC
  Administered 2019-04-01: 1000 ug via INTRAMUSCULAR

## 2019-04-01 NOTE — Telephone Encounter (Signed)
Please check with me about her B12 injections. I am confused as to why she is continuing to get them 2x per month.

## 2019-04-04 NOTE — Telephone Encounter (Signed)
Yes, totally fine; orders in place for both; thanks-

## 2019-04-04 NOTE — Telephone Encounter (Signed)
Routing to laura, I have explained the process of how we usually handle low b12 lab values and patient understands to come in to our lab at the end of next week (around 7/10) at her convenience to have b12 lab rechecked----patient is also requesting to have vitamin D level rechecked, too---ashleigh originally sent in rx for vitamin D (once weekly), patient did complete that and has been taking OTC vitamin D since then---routing to laura, can we add vitamin D lab request so that she can recheck that level, too?---please advise, thanks

## 2019-04-04 NOTE — Addendum Note (Signed)
Addended by: Sherlene Shams on: 04/04/2019 12:02 PM   Modules accepted: Orders

## 2019-04-05 NOTE — Telephone Encounter (Addendum)
Patient advised that we are cancelling nurse visit for 04/07/19 and for 04/15/19---we need to allow time to have accurate b12 lab value by skipping this week and patient coming in at end of next week to have lab value retested---we will come up with plan of care once we know how well patient has responded to what we've done thus far---patient will be advised of that plan after next week's lab results

## 2019-04-07 ENCOUNTER — Ambulatory Visit: Payer: BC Managed Care – PPO

## 2019-04-15 ENCOUNTER — Ambulatory Visit: Payer: BC Managed Care – PPO

## 2019-04-22 ENCOUNTER — Other Ambulatory Visit (INDEPENDENT_AMBULATORY_CARE_PROVIDER_SITE_OTHER): Payer: BC Managed Care – PPO

## 2019-04-22 DIAGNOSIS — E559 Vitamin D deficiency, unspecified: Secondary | ICD-10-CM

## 2019-04-22 DIAGNOSIS — E538 Deficiency of other specified B group vitamins: Secondary | ICD-10-CM

## 2019-04-22 LAB — VITAMIN D 25 HYDROXY (VIT D DEFICIENCY, FRACTURES): VITD: 25.84 ng/mL — ABNORMAL LOW (ref 30.00–100.00)

## 2019-04-22 LAB — VITAMIN B12: Vitamin B-12: >1500 pg/mL — ABNORMAL HIGH (ref 211–911)

## 2019-08-23 ENCOUNTER — Other Ambulatory Visit: Payer: Self-pay | Admitting: Internal Medicine

## 2019-08-23 ENCOUNTER — Encounter: Payer: Self-pay | Admitting: Internal Medicine

## 2019-08-25 DIAGNOSIS — E538 Deficiency of other specified B group vitamins: Secondary | ICD-10-CM | POA: Insufficient documentation

## 2019-08-25 DIAGNOSIS — E559 Vitamin D deficiency, unspecified: Secondary | ICD-10-CM | POA: Insufficient documentation
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
## 2019-08-25 NOTE — Patient Instructions (Addendum)
Tests ordered today. Your results will be released to MyChart (or called to you) after review.  If any changes need to be made, you will be notified at that same time.  All other Health Maintenance issues reviewed.   All recommended immunizations and age-appropriate screenings are up-to-date or discussed.  No immunization administered today.   Medications reviewed and updated.  Changes include :   none   Please followup in 1 year   Health Maintenance, Female Adopting a healthy lifestyle and getting preventive care are important in promoting health and wellness. Ask your health care provider about:  The right schedule for you to have regular tests and exams.  Things you can do on your own to prevent diseases and keep yourself healthy. What should I know about diet, weight, and exercise? Eat a healthy diet   Eat a diet that includes plenty of vegetables, fruits, low-fat dairy products, and lean protein.  Do not eat a lot of foods that are high in solid fats, added sugars, or sodium. Maintain a healthy weight Body mass index (BMI) is used to identify weight problems. It estimates body fat based on height and weight. Your health care provider can help determine your BMI and help you achieve or maintain a healthy weight. Get regular exercise Get regular exercise. This is one of the most important things you can do for your health. Most adults should:  Exercise for at least 150 minutes each week. The exercise should increase your heart rate and make you sweat (moderate-intensity exercise).  Do strengthening exercises at least twice a week. This is in addition to the moderate-intensity exercise.  Spend less time sitting. Even light physical activity can be beneficial. Watch cholesterol and blood lipids Have your blood tested for lipids and cholesterol at 40 years of age, then have this test every 5 years. Have your cholesterol levels checked more often if:  Your lipid or cholesterol  levels are high.  You are older than 40 years of age.  You are at high risk for heart disease. What should I know about cancer screening? Depending on your health history and family history, you may need to have cancer screening at various ages. This may include screening for:  Breast cancer.  Cervical cancer.  Colorectal cancer.  Skin cancer.  Lung cancer. What should I know about heart disease, diabetes, and high blood pressure? Blood pressure and heart disease  High blood pressure causes heart disease and increases the risk of stroke. This is more likely to develop in people who have high blood pressure readings, are of African descent, or are overweight.  Have your blood pressure checked: ? Every 3-5 years if you are 18-39 years of age. ? Every year if you are 40 years old or older. Diabetes Have regular diabetes screenings. This checks your fasting blood sugar level. Have the screening done:  Once every three years after age 40 if you are at a normal weight and have a low risk for diabetes.  More often and at a younger age if you are overweight or have a high risk for diabetes. What should I know about preventing infection? Hepatitis B If you have a higher risk for hepatitis B, you should be screened for this virus. Talk with your health care provider to find out if you are at risk for hepatitis B infection. Hepatitis C Testing is recommended for:  Everyone born from 1945 through 1965.  Anyone with known risk factors for hepatitis C. Sexually transmitted   infections (STIs)  Get screened for STIs, including gonorrhea and chlamydia, if: ? You are sexually active and are younger than 40 years of age. ? You are older than 40 years of age and your health care provider tells you that you are at risk for this type of infection. ? Your sexual activity has changed since you were last screened, and you are at increased risk for chlamydia or gonorrhea. Ask your health care  provider if you are at risk.  Ask your health care provider about whether you are at high risk for HIV. Your health care provider may recommend a prescription medicine to help prevent HIV infection. If you choose to take medicine to prevent HIV, you should first get tested for HIV. You should then be tested every 3 months for as long as you are taking the medicine. Pregnancy  If you are about to stop having your period (premenopausal) and you may become pregnant, seek counseling before you get pregnant.  Take 400 to 800 micrograms (mcg) of folic acid every day if you become pregnant.  Ask for birth control (contraception) if you want to prevent pregnancy. Osteoporosis and menopause Osteoporosis is a disease in which the bones lose minerals and strength with aging. This can result in bone fractures. If you are 65 years old or older, or if you are at risk for osteoporosis and fractures, ask your health care provider if you should:  Be screened for bone loss.  Take a calcium or vitamin D supplement to lower your risk of fractures.  Be given hormone replacement therapy (HRT) to treat symptoms of menopause. Follow these instructions at home: Lifestyle  Do not use any products that contain nicotine or tobacco, such as cigarettes, e-cigarettes, and chewing tobacco. If you need help quitting, ask your health care provider.  Do not use street drugs.  Do not share needles.  Ask your health care provider for help if you need support or information about quitting drugs. Alcohol use  Do not drink alcohol if: ? Your health care provider tells you not to drink. ? You are pregnant, may be pregnant, or are planning to become pregnant.  If you drink alcohol: ? Limit how much you use to 0-1 drink a day. ? Limit intake if you are breastfeeding.  Be aware of how much alcohol is in your drink. In the [place] one drink equals one 12 oz bottle of beer (355 mL), one 5 oz glass of wine (148 mL), or one 1  oz glass of hard liquor (44 mL). General instructions  Schedule regular health, dental, and eye exams.  Stay current with your vaccines.  Tell your health care provider if: ? You often feel depressed. ? You have ever been abused or do not feel safe at home. Summary  Adopting a healthy lifestyle and getting preventive care are important in promoting health and wellness.  Follow your health care provider's instructions about healthy diet, exercising, and getting tested or screened for diseases.  Follow your health care provider's instructions on monitoring your cholesterol and blood pressure. This information is not intended to replace advice given to you by your health care provider. Make sure you discuss any questions you have with your health care provider. Document Released: 04/07/2011 Document Revised: 09/15/2018 Document Reviewed: 09/15/2018 Elsevier Patient Education  2020 Elsevier Inc.  

## 2019-08-26 ENCOUNTER — Other Ambulatory Visit: Payer: Self-pay

## 2019-08-26 ENCOUNTER — Encounter: Payer: Self-pay | Admitting: Internal Medicine

## 2019-08-26 ENCOUNTER — Other Ambulatory Visit (INDEPENDENT_AMBULATORY_CARE_PROVIDER_SITE_OTHER): Payer: BC Managed Care – PPO

## 2019-08-26 ENCOUNTER — Ambulatory Visit (INDEPENDENT_AMBULATORY_CARE_PROVIDER_SITE_OTHER): Payer: BC Managed Care – PPO | Admitting: Internal Medicine

## 2019-08-26 VITALS — BP 112/70 | HR 64 | Temp 98.3°F | Ht 63.0 in | Wt 132.0 lb

## 2019-08-26 DIAGNOSIS — R1013 Epigastric pain: Secondary | ICD-10-CM | POA: Diagnosis not present

## 2019-08-26 DIAGNOSIS — E538 Deficiency of other specified B group vitamins: Secondary | ICD-10-CM

## 2019-08-26 DIAGNOSIS — E559 Vitamin D deficiency, unspecified: Secondary | ICD-10-CM | POA: Diagnosis not present

## 2019-08-26 DIAGNOSIS — Z0000 Encounter for general adult medical examination without abnormal findings: Secondary | ICD-10-CM

## 2019-08-26 DIAGNOSIS — R634 Abnormal weight loss: Secondary | ICD-10-CM

## 2019-08-26 LAB — COMPREHENSIVE METABOLIC PANEL
ALT: 9 U/L (ref 0–35)
AST: 16 U/L (ref 0–37)
Albumin: 4.5 g/dL (ref 3.5–5.2)
Alkaline Phosphatase: 74 U/L (ref 39–117)
BUN: 13 mg/dL (ref 6–23)
CO2: 26 mEq/L (ref 19–32)
Calcium: 9.5 mg/dL (ref 8.4–10.5)
Chloride: 107 mEq/L (ref 96–112)
Creatinine, Ser: 0.73 mg/dL (ref 0.40–1.20)
GFR: 88.08 mL/min (ref >60.00)
Glucose, Bld: 83 mg/dL (ref 70–99)
Potassium: 4.2 mEq/L (ref 3.5–5.1)
Sodium: 141 mEq/L (ref 135–145)
Total Bilirubin: 0.5 mg/dL (ref 0.2–1.2)
Total Protein: 7.1 g/dL (ref 6.0–8.3)

## 2019-08-26 LAB — CBC WITH DIFFERENTIAL/PLATELET
Basophils Absolute: 0 10*3/uL (ref 0.0–0.1)
Basophils Relative: 0.3 % (ref 0.0–3.0)
Eosinophils Absolute: 0.1 10*3/uL (ref 0.0–0.7)
Eosinophils Relative: 2.2 % (ref 0.0–5.0)
HCT: 39.3 % (ref 36.0–46.0)
Hemoglobin: 13.2 g/dL (ref 12.0–15.0)
Lymphocytes Relative: 27.3 % (ref 12.0–46.0)
Lymphs Abs: 1.4 10*3/uL (ref 0.7–4.0)
MCHC: 33.7 g/dL (ref 30.0–36.0)
MCV: 91.1 fl (ref 78.0–100.0)
Monocytes Absolute: 0.5 10*3/uL (ref 0.1–1.0)
Monocytes Relative: 8.9 % (ref 3.0–12.0)
Neutro Abs: 3.2 10*3/uL (ref 1.4–7.7)
Neutrophils Relative %: 61.3 % (ref 43.0–77.0)
Platelets: 182 10*3/uL (ref 150.0–400.0)
RBC: 4.31 Mil/uL (ref 3.87–5.11)
RDW: 12.6 % (ref 11.5–15.5)
WBC: 5.3 10*3/uL (ref 4.0–10.5)

## 2019-08-26 LAB — LIPID PANEL
Cholesterol: 133 mg/dL (ref 0–200)
HDL: 58.4 mg/dL (ref >39.00)
LDL Cholesterol: 58 mg/dL (ref 0–99)
NonHDL: 74.11
Total CHOL/HDL Ratio: 2
Triglycerides: 83 mg/dL (ref 0.0–149.0)
VLDL: 16.6 mg/dL (ref 0.0–40.0)

## 2019-08-26 LAB — TSH: TSH: 1.41 u[IU]/mL (ref 0.35–4.50)

## 2019-08-26 LAB — VITAMIN B12: Vitamin B-12: 310 pg/mL (ref 211–911)

## 2019-08-26 LAB — VITAMIN D 25 HYDROXY (VIT D DEFICIENCY, FRACTURES): VITD: 24.88 ng/mL — ABNORMAL LOW (ref 30.00–100.00)

## 2019-08-26 NOTE — Assessment & Plan Note (Signed)
Check level Not taking vitamin B12 regularly

## 2019-08-26 NOTE — Assessment & Plan Note (Addendum)
She is experiencing epigastric discomfort or burning sensation, but only at night when she lays down.  If she changes position that goes away She denies any GERD, nausea or reflux symptoms Likely musculoskeletal since it resolves with position change and only occurs when laying down Monitor for now-advised her to contact me if her symptoms do not improve or worsen

## 2019-08-26 NOTE — Assessment & Plan Note (Addendum)
Over the past year she has lost weight.  She stopped eating milk products and decreased her carbs significantly.  Overall she is eating very well She is also been boxing regularly Initially the weight loss was gradual, but it has persisted and she was concerned that that was normal or not She does feel that her exercise intensity has increased over the past few months  We will check basic blood work Most likely this is within normal limits of weight loss for her activity and diet, but advised that she need to continue to monitor her weight and if she continues to lose weight she needs to let me know, but I would expected to level off

## 2019-08-26 NOTE — Assessment & Plan Note (Signed)
Check level Not taking vitamin D regularly

## 2019-08-30 ENCOUNTER — Encounter: Payer: Self-pay | Admitting: Nurse Practitioner

## 2019-08-30 MED ORDER — OMEPRAZOLE 20 MG PO CPDR: 20.0000 mg | DELAYED_RELEASE_CAPSULE | Freq: Every day | ORAL | 3 refills | Status: DC

## 2019-09-21 ENCOUNTER — Ambulatory Visit: Payer: BC Managed Care – PPO | Admitting: Nurse Practitioner

## 2019-09-26 ENCOUNTER — Ambulatory Visit: Payer: BC Managed Care – PPO | Admitting: Gastroenterology

## 2019-10-13 ENCOUNTER — Encounter: Payer: Self-pay | Admitting: *Deleted

## 2019-10-20 ENCOUNTER — Ambulatory Visit: Payer: BC Managed Care – PPO | Admitting: Gastroenterology

## 2019-12-05 ENCOUNTER — Encounter: Payer: Self-pay | Admitting: Physician Assistant

## 2019-12-15 ENCOUNTER — Ambulatory Visit: Payer: BC Managed Care – PPO | Admitting: Physician Assistant

## 2019-12-15 ENCOUNTER — Encounter: Payer: Self-pay | Admitting: Physician Assistant

## 2019-12-15 ENCOUNTER — Other Ambulatory Visit: Payer: Self-pay

## 2019-12-15 VITALS — BP 100/60 | HR 67 | Temp 98.7°F | Ht 63.0 in | Wt 135.0 lb

## 2019-12-15 DIAGNOSIS — R140 Abdominal distension (gaseous): Secondary | ICD-10-CM

## 2019-12-15 DIAGNOSIS — Z01818 Encounter for other preprocedural examination: Secondary | ICD-10-CM

## 2019-12-15 DIAGNOSIS — R1013 Epigastric pain: Secondary | ICD-10-CM | POA: Diagnosis not present

## 2019-12-15 DIAGNOSIS — K5900 Constipation, unspecified: Secondary | ICD-10-CM

## 2019-12-15 DIAGNOSIS — R112 Nausea with vomiting, unspecified: Secondary | ICD-10-CM | POA: Diagnosis not present

## 2019-12-15 NOTE — Progress Notes (Signed)
Agree with assessment and plan as outlined.  

## 2019-12-15 NOTE — Progress Notes (Signed)
Subjective:    Patient ID: Kelly Figueroa, female    DOB: September 22, 1981, 39 y.o.   MRN: [id number]  HPI Kelly Figueroa is a pleasant 39 year old female, new to GI today referred by Dr. Billey Figueroa for evaluation of persistent epigastric pain, nausea and chronic constipation. Patient says her current symptoms have been present over the past couple of months, but that she has had chronic constipation for many many years. She has been on omeprazole 20 mg p.o. daily over the past month and says that has definitely helped the epigastric pain.  She had been experiencing burning epigastric pain which was waking her frequently at night.  She was also experiencing a low level of constant nausea which persists and says she gets nauseated quickly after eating but has not been vomiting.  Appetite has been okay, weight down about 5 pounds.  She had been having some abdominal bloating which has improved with omeprazole as well. No regular aspirin or NSAIDs, no EtOH.  No family history of GI disease as far she is aware. Has not had any abdominal imaging, she did have baseline labs in November 2020 with normal CBC and c-Met. As far as her constipation is concerned she usually has a bowel movement 1-2 times per week.  She had been tried on Linzess in the past without any benefit and also tried MiraLAX on daily basis for a month with out any improvement in symptoms.  She has taken Dulcolax as needed but this usually causes cramping.  She tries to follow a high-fiber diet and drinks plenty of fluids.  No rectal bleeding .  Review of Systems Pertinent positive and negative review of systems were noted in the above HPI section.  All other review of systems was otherwise negative.  Outpatient Encounter Medications as of 12/15/2019  Medication Sig  . BIOTIN PO Take by mouth. 569m daily  . Cholecalciferol (VITAMIN D) 50 MCG (2000 UT) tablet Take 2,000 Units by mouth daily.  .Marland Kitchenomeprazole (PRILOSEC) 20 MG capsule Take 1  capsule (20 mg total) by mouth daily.  . vitamin B-12 (CYANOCOBALAMIN) 1000 MCG tablet Take 1,000 mcg by mouth daily.   No facility-administered encounter medications on file as of 12/15/2019.   Allergies  Allergen Reactions  . Milk Protein    Patient Active Problem List   Diagnosis Date Noted  . Epigastric discomfort 08/26/2019  . Weight loss 08/26/2019  . Vitamin D deficiency 08/25/2019  . Vitamin B12 deficiency 08/25/2019  . Cervical intraepithelial neoplasia grade 1 07/21/2017  . Bilateral low back pain with right-sided sciatica 06/04/2016   Social History   Socioeconomic History  . Marital status: Married    Spouse name: Not on file  . Number of children: 3  . Years of education: 4  . Highest education level: Not on file  Occupational History  . Occupation: aPassenger transport manager Tobacco Use  . Smoking status: Never Smoker  . Smokeless tobacco: Never Used  Substance and Sexual Activity  . Alcohol use: No    Alcohol/week: 0.0 standard drinks  . Drug use: No  . Sexual activity: Yes    Partners: Male    Birth control/protection: Condom  Other Topics Concern  . Not on file  Social History Narrative   Lives with husband and 3 children in a 2 story home.     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   Reading for fun.   Social Determinants of Health  Financial Resource Strain:   . Difficulty of Paying Living Expenses:   Food Insecurity:   . Worried About Charity fundraiser in the Last Year:   . Arboriculturist in the Last Year:   Transportation Needs:   . Film/video editor (Medical):   Marland Kitchen Lack of Transportation (Non-Medical):   Physical Activity:   . Days of Exercise per Week:   . Minutes of Exercise per Session:   Stress:   . Feeling of Stress :   Social Connections:   . Frequency of Communication with Friends and Family:   . Frequency of Social Gatherings with Friends and Family:   . Attends Religious Services:     . Active Member of Clubs or Organizations:   . Attends Archivist Meetings:   Marland Kitchen Marital Status:   Intimate Partner Violence:   . Fear of Current or Ex-Partner:   . Emotionally Abused:   Marland Kitchen Physically Abused:   . Sexually Abused:     [name] family history includes Breast cancer in her paternal grandmother; Diabetes in her father; Esophageal cancer in her paternal uncle; Healthy in her daughter and son; Liver disease in her mother; Lupus in an other family member; Suicidality in her paternal grandfather.      Objective:    Vitals:   12/15/19 0829  BP: 100/60  Pulse: 67  Temp: 98.7 F (37.1 C)    Physical Exam Well-developed well-nourished female/ in no acute distress.  Height,  BMI 23.9  HEENT; nontraumatic normocephalic, EOMI, PER R LA, sclera anicteric. Oropharynx; not examined Neck; supple, no JVD Cardiovascular; regular rate and rhythm with S1-S2, no murmur rub or gallop Pulmonary; Clear bilaterally Abdomen; soft, mild epigastric tenderness, no guarding nondistended, no palpable mass or hepatosplenomegaly, bowel sounds are active Rectal; not done today Skin; benign exam, no jaundice rash or appreciable lesions Extremities; no clubbing cyanosis or edema skin warm and dry Neuro/Psych; alert and oriented x4, grossly nonfocal mood and affect appropriate       Assessment & Plan:   #5 39 year old female with 69-monthhistory of burning epigastric pain, frequently waking her at night, some early satiety and nausea worse after eating. Patient has had some improvement on low-dose omeprazole though symptoms persist they are not as severe.  Rule out peptic ulcer disease, chronic gastropathy, H. pylori.  Rule out gallbladder disease, rule out component of gastroparesis.  #2 chronic long-term constipation  Plan; Continue omeprazole 20 mg p.o. daily for now. Schedule for upper endoscopy with Dr. AHavery Figueroa  Procedure was discussed in detail with the patient  including indications risks and benefits and she is agreeable to proceed. We will schedule for upper abdominal ultrasound We discussed management of chronic constipation, encourage 60-80 ounces of water daily, high-fiber.  She wishes to avoid prescription medications for constipation at this point.  She will try Senokot 1-2 p.o. every afternoon. Further recommendations pending results of above.  Kelly Eisler SGenia HaroldPA-C 12/15/2019   Cc: BBinnie Rail MD

## 2019-12-15 NOTE — Patient Instructions (Signed)
You have been scheduled for an abdominal ultrasound at [facility] (1st floor of hospital) on 12/22/19 at 9:00am. Please arrive 15 minutes prior to your appointment for registration. Make certain not to have anything to eat or drink 6 hours prior to your appointment. Should you need to reschedule your appointment, please contact radiology at [phone]. This test typically takes about 30 minutes to perform.  You have been scheduled for an endoscopy. Please follow written instructions given to you at your visit today. If you use inhalers (even only as needed), please bring them with you on the day of your procedure.   Continue Omeprazole 20mg  once daily.   Try Senekot 1-2 daily at bedtime to help with Constipation.   If you are age 30 or younger, your body mass index should be between 19-25. Your Body mass index is 23.91 kg/m. If this is out of the aformentioned range listed, please consider follow up with your Primary Care Provider.   Thank you for choosing me and [facility].

## 2019-12-21 ENCOUNTER — Ambulatory Visit (INDEPENDENT_AMBULATORY_CARE_PROVIDER_SITE_OTHER): Payer: BC Managed Care – PPO

## 2019-12-21 DIAGNOSIS — Z1159 Encounter for screening for other viral diseases: Secondary | ICD-10-CM

## 2019-12-21 LAB — SARS CORONAVIRUS 2 (TAT 6-24 HRS): SARS Coronavirus 2: NEGATIVE

## 2019-12-22 ENCOUNTER — Other Ambulatory Visit: Payer: Self-pay

## 2019-12-22 ENCOUNTER — Ambulatory Visit (HOSPITAL_COMMUNITY)
Admission: RE | Admit: 2019-12-22 | Discharge: 2019-12-22 | Disposition: A | Discharge status: Home / Self Care | Payer: BC Managed Care – PPO | Source: Ambulatory Visit | Attending: Physician Assistant | Admitting: Physician Assistant

## 2019-12-22 DIAGNOSIS — K5900 Constipation, unspecified: Secondary | ICD-10-CM

## 2019-12-22 DIAGNOSIS — R1013 Epigastric pain: Secondary | ICD-10-CM

## 2019-12-22 DIAGNOSIS — R140 Abdominal distension (gaseous): Secondary | ICD-10-CM | POA: Diagnosis present

## 2019-12-22 DIAGNOSIS — R112 Nausea with vomiting, unspecified: Secondary | ICD-10-CM

## 2019-12-23 ENCOUNTER — Ambulatory Visit (AMBULATORY_SURGERY_CENTER): Payer: BC Managed Care – PPO | Admitting: Gastroenterology

## 2019-12-23 ENCOUNTER — Encounter: Payer: Self-pay | Admitting: Gastroenterology

## 2019-12-23 VITALS — BP 96/60 | HR 79 | Temp 97.3°F | Resp 12 | Ht 63.0 in | Wt 135.0 lb

## 2019-12-23 DIAGNOSIS — K449 Diaphragmatic hernia without obstruction or gangrene: Secondary | ICD-10-CM

## 2019-12-23 DIAGNOSIS — K259 Gastric ulcer, unspecified as acute or chronic, without hemorrhage or perforation: Secondary | ICD-10-CM | POA: Diagnosis not present

## 2019-12-23 DIAGNOSIS — K2950 Unspecified chronic gastritis without bleeding: Secondary | ICD-10-CM

## 2019-12-23 DIAGNOSIS — R1013 Epigastric pain: Secondary | ICD-10-CM

## 2019-12-23 MED ORDER — OMEPRAZOLE 40 MG PO CPDR: 40.0000 mg | DELAYED_RELEASE_CAPSULE | Freq: Two times a day (BID) | ORAL | 3 refills | Status: DC

## 2019-12-23 MED ORDER — SODIUM CHLORIDE 0.9 % IV SOLN: 500.0000 mL | Freq: Once | INTRAVENOUS | Status: DC

## 2019-12-23 MED ORDER — SUCRALFATE 1 GM/10ML PO SUSP: 1.0000 g | Freq: Four times a day (QID) | ORAL | 1 refills | Status: DC | PRN

## 2019-12-23 NOTE — Progress Notes (Signed)
Kelly Figueroa

## 2019-12-23 NOTE — Progress Notes (Signed)
Pt's states no medical or surgical changes since previsit or office visit. 

## 2019-12-23 NOTE — Progress Notes (Signed)
Called to room to assist during endoscopic procedure.  Patient ID and intended procedure confirmed with present staff. Received instructions for my participation in the procedure from the performing physician.  

## 2019-12-23 NOTE — Op Note (Addendum)
[facility] Patient Name: Averii Pavy Procedure Date: 12/23/2019 10:36 AM MRN: [id number] Endoscopist: Remo Lipps P. Havery Moros , MD Age: 39 Referring MD:  Date of Birth: October 11, 1980 Gender: Female Account #: [id number] Procedure:                Upper GI endoscopy Indications:              Epigastric abdominal pain, negative [place] - no                            gallstones, on omeprazole 20mg  daily Medicines:                Monitored Anesthesia Care Procedure:                Pre-Anesthesia Assessment:                           - Prior to the procedure, a History and Physical                            was performed, and patient medications and                            allergies were reviewed. The patient's tolerance of                            previous anesthesia was also reviewed. The risks                            and benefits of the procedure and the sedation                            options and risks were discussed with the patient.                            All questions were answered, and informed consent                            was obtained. Prior Anticoagulants: The patient has                            taken no previous anticoagulant or antiplatelet                            agents. ASA Grade Assessment: II - A patient with                            mild systemic disease. After reviewing the risks                            and benefits, the patient was deemed in                            satisfactory condition to undergo the procedure.  After obtaining informed consent, the endoscope was                            passed under direct vision. Throughout the                            procedure, the patient's blood pressure, pulse, and                            oxygen saturations were monitored continuously. The                            Endoscope was introduced through the mouth, and                            advanced to  the second part of duodenum. The upper                            GI endoscopy was accomplished without difficulty.                            The patient tolerated the procedure well. Scope In: Scope Out: Findings:                 Esophagogastric landmarks were identified: the                            Z-line was found at 35 cm, the gastroesophageal                            junction was found at 35 cm and the upper extent of                            the gastric folds was found at 36 cm from the                            incisors.                           A 1 cm hiatal hernia was present.                           The exam of the esophagus was otherwise normal.                           One non-bleeding cratered clean based gastric ulcer                            with no stigmata of bleeding was found in the                            gastric antrum. The lesion was 4 mm in largest  dimension. Inflamed / nodular tissue was noted                            surrounding it. Biopsies from the periphery of the                            ulcer were taken with a cold forceps for histology.                           The exam of the stomach was otherwise normal.                           Biopsies were taken with a cold forceps in the                            gastric body, at the incisura and in the gastric                            antrum for Helicobacter pylori testing.                           The duodenal bulb and second portion of the                            duodenum were normal. Complications:            No immediate complications. Estimated blood loss:                            Minimal. Estimated Blood Loss:     Estimated blood loss was minimal. Impression:               - Esophagogastric landmarks identified.                           - 1 cm hiatal hernia.                           - Normal esophagus.                           - Non-bleeding  gastric ulcer as outlined above,                            which is the likely cause of the patient's                            symptoms. Biopsied.                           - Normal stomach otherwise - biopsies taken to rule                            out H pylori.                           -  Normal duodenal bulb and second portion of the                            duodenum.                           - Biopsies were taken with a cold forceps for                            Helicobacter pylori testing. Recommendation:           - Patient has a contact number available for                            emergencies. The signs and symptoms of potential                            delayed complications were discussed with the                            patient. Return to normal activities tomorrow.                            Written discharge instructions were provided to the                            patient.                           - Resume previous diet.                           - Continue present medications.                           - Increase omeprazole to 40mg  twice daily                           - Add Carafate tablet - 1gm PO once every 6 hours                            as needed                           - Await pathology results.                           - Avoid NSAIDs Carlota Raspberry. Afsana Liera, MD 12/23/2019 10:56:59 AM This report has been signed electronically.

## 2019-12-23 NOTE — Progress Notes (Signed)
A and O x3. Report to RN. Tolerated MAC anesthesia well.Teeth unchanged after procedure.

## 2019-12-23 NOTE — Patient Instructions (Signed)
Impression/Recommendations:  Hiatal hernia handout given to patient.  Resume previous diet. Continue present medications. Increase omprazole to 40 mg. Twice daily. Add Carafate tablet 1 gm once every 6 hours as needed.  Await pathology results.  YOU HAD AN ENDOSCOPIC PROCEDURE TODAY AT [facility]:   Refer to the procedure report that was given to you for any specific questions about what was found during the examination.  If the procedure report does not answer your questions, please call your gastroenterologist to clarify.  If you requested that your care partner not be given the details of your procedure findings, then the procedure report has been included in a sealed envelope for you to review at your convenience later.  YOU SHOULD EXPECT: Some feelings of bloating in the abdomen. Passage of more gas than usual.  Walking can help get rid of the air that was put into your GI tract during the procedure and reduce the bloating. If you had a lower endoscopy (such as a colonoscopy or flexible sigmoidoscopy) you may notice spotting of blood in your stool or on the toilet paper. If you underwent a bowel prep for your procedure, you may not have a normal bowel movement for a few days.  Please Note:  You might notice some irritation and congestion in your nose or some drainage.  This is from the oxygen used during your procedure.  There is no need for concern and it should clear up in a day or so.  SYMPTOMS TO REPORT IMMEDIATELY:  Following upper endoscopy (EGD)  Vomiting of blood or coffee ground material  New chest pain or pain under the shoulder blades  Painful or persistently difficult swallowing  New shortness of breath  Fever of 100F or higher  Black, tarry-looking stools  For urgent or emergent issues, a gastroenterologist can be reached at any hour by calling [phone]. Do not use MyChart messaging for urgent concerns.    DIET:  We do recommend a small meal  at first, but then you may proceed to your regular diet.  Drink plenty of fluids but you should avoid alcoholic beverages for 24 hours.  ACTIVITY:  You should plan to take it easy for the rest of today and you should NOT DRIVE or use heavy machinery until tomorrow (because of the sedation medicines used during the test).    FOLLOW UP: Our staff will call the number listed on your records 48-72 hours following your procedure to check on you and address any questions or concerns that you may have regarding the information given to you following your procedure. If we do not reach you, we will leave a message.  We will attempt to reach you two times.  During this call, we will ask if you have developed any symptoms of COVID 19. If you develop any symptoms (ie: fever, flu-like symptoms, shortness of breath, cough etc.) before then, please call [phone].  If you test positive for Covid 19 in the 2 weeks post procedure, please call and report this information to [place].    If any biopsies were taken you will be contacted by phone or by letter within the next 1-3 weeks.  Please call us at [phone] if you have not heard about the biopsies in 3 weeks.    SIGNATURES/CONFIDENTIALITY: You and/or your care partner have signed paperwork which will be entered into your electronic medical record.  These signatures attest to the fact that that the information above on your After Visit  Summary has been reviewed and is understood.  Full responsibility of the confidentiality of this discharge information lies with you and/or your care-partner. 

## 2019-12-27 ENCOUNTER — Telehealth: Payer: Self-pay

## 2019-12-27 NOTE — Telephone Encounter (Signed)
  Follow up Call-  Call back number 12/23/2019  Post procedure Call Back phone  # [phone]  Permission to leave phone message Yes  Some recent data might be hidden     Patient questions:  Do you have a fever, pain , or abdominal swelling? No. Pain Score  0 *  Have you tolerated food without any problems? Yes.    Have you been able to return to your normal activities? Yes.    Do you have any questions about your discharge instructions: Diet   No. Medications  No. Follow up visit  No.  Do you have questions or concerns about your Care? No.  Actions: * If pain score is 4 or above: 1. No action needed, pain <4.Have you developed a fever since your procedure? no  2.   Have you had an respiratory symptoms (SOB or cough) since your procedure? no  3.   Have you tested positive for COVID 19 since your procedure no  4.   Have you had any family members/close contacts diagnosed with the COVID 19 since your procedure?  no   If yes to any of these questions please route to Joylene John, RN and Alphonsa Gin, Therapist, sports.

## 2019-12-28 ENCOUNTER — Telehealth: Payer: Self-pay

## 2019-12-28 NOTE — Telephone Encounter (Signed)
Dr. Havery Moros increased pt's omeprazole 20 mg to 40 mg twice a day for one month to treat  gastric ulcer as seen on EGD 12-23-19. Insurance requires a PA for omeprazole 40mg  bid.  Submitted PA via CoverMyMeds on 3-23. Called and let pt know I am waiting for their reply. In the mean time she can supplement with 2 tablets of over the counter omeprazole 20mg  in order to take bid for 1 month to treat ulcer.  She expressed understanding.

## 2019-12-29 NOTE — Telephone Encounter (Signed)
Omeprazole 40mg  BID: PA approved Key: [id number] - PA Case ID: [id number] - Rx #[id number].  Request Reference Number: [id number]. OMEPRAZOLE CAP 40MG  BID is approved through 12/26/2020. Called and informed patient.
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
## 2020-05-30 ENCOUNTER — Encounter: Payer: Self-pay | Admitting: Internal Medicine

## 2020-05-30 ENCOUNTER — Other Ambulatory Visit: Payer: Self-pay

## 2020-05-30 ENCOUNTER — Ambulatory Visit: Payer: BC Managed Care – PPO | Admitting: Internal Medicine

## 2020-05-30 DIAGNOSIS — G43009 Migraine without aura, not intractable, without status migrainosus: Secondary | ICD-10-CM | POA: Diagnosis not present

## 2020-05-30 MED ORDER — VITAMIN D 50 MCG (2000 UT) PO TABS: 2000.0000 [IU] | ORAL_TABLET | Freq: Every day | ORAL | Status: AC

## 2020-05-30 MED ORDER — PROBIOTIC BLEND PO CAPS: 1.0000 | ORAL_CAPSULE | Freq: Every day | ORAL | Status: DC

## 2020-05-30 MED ORDER — UBRELVY 100 MG PO TABS: ORAL_TABLET | ORAL | 5 refills | Status: DC

## 2020-05-30 NOTE — Patient Instructions (Signed)
Try Ubrevly for your migraines.    Let me know if this works.     Migraine Headache A migraine headache is an intense, throbbing pain on one side or both sides of the head. Migraine headaches may also cause other symptoms, such as nausea, vomiting, and sensitivity to light and noise. A migraine headache can last from 4 hours to 3 days. Talk with your doctor about what things may bring on (trigger) your migraine headaches. What are the causes? The exact cause of this condition is not known. However, a migraine may be caused when nerves in the brain become irritated and release chemicals that cause inflammation of blood vessels. This inflammation causes pain. This condition may be triggered or caused by:  Drinking alcohol.  Smoking.  Taking medicines, such as: ? Medicine used to treat chest pain (nitroglycerin). ? Birth control pills. ? Estrogen. ? Certain blood pressure medicines.  Eating or drinking products that contain nitrates, glutamate, aspartame, or tyramine. Aged cheeses, chocolate, or caffeine may also be triggers.  Doing physical activity. Other things that may trigger a migraine headache include:  Menstruation.  Pregnancy.  Hunger.  Stress.  Lack of sleep or too much sleep.  Weather changes.  Fatigue. What increases the risk? The following factors may make you more likely to experience migraine headaches:  Being a certain age. This condition is more common in people who are 76-40 years old.  Being female.  Having a family history of migraine headaches.  Being Caucasian.  Having a mental health condition, such as depression or anxiety.  Being obese. What are the signs or symptoms? The main symptom of this condition is pulsating or throbbing pain. This pain may:  Happen in any area of the head, such as on one side or both sides.  Interfere with daily activities.  Get worse with physical activity.  Get worse with exposure to bright lights or loud  noises. Other symptoms may include:  Nausea.  Vomiting.  Dizziness.  General sensitivity to bright lights, loud noises, or smells. Before you get a migraine headache, you may get warning signs (an aura). An aura may include:  Seeing flashing lights or having blind spots.  Seeing bright spots, halos, or zigzag lines.  Having tunnel vision or blurred vision.  Having numbness or a tingling feeling.  Having trouble talking.  Having muscle weakness. Some people have symptoms after a migraine headache (postdromal phase), such as:  Feeling tired.  Difficulty concentrating. How is this diagnosed? A migraine headache can be diagnosed based on:  Your symptoms.  A physical exam.  Tests, such as: ? CT scan or an MRI of the head. These imaging tests can help rule out other causes of headaches. ? Taking fluid from the spine (lumbar puncture) and analyzing it (cerebrospinal fluid analysis, or CSF analysis). How is this treated? This condition may be treated with medicines that:  Relieve pain.  Relieve nausea.  Prevent migraine headaches. Treatment for this condition may also include:  Acupuncture.  Lifestyle changes like avoiding foods that trigger migraine headaches.  Biofeedback.  Cognitive behavioral therapy. Follow these instructions at home: Medicines  Take over-the-counter and prescription medicines only as told by your health care provider.  Ask your health care provider if the medicine prescribed to you: ? Requires you to avoid driving or using heavy machinery. ? Can cause constipation. You may need to take these actions to prevent or treat constipation:  Drink enough fluid to keep your urine pale yellow.  Take  over-the-counter or prescription medicines.  Eat foods that are high in fiber, such as beans, whole grains, and fresh fruits and vegetables.  Limit foods that are high in fat and processed sugars, such as fried or sweet foods. Lifestyle  Do not  drink alcohol.  Do not use any products that contain nicotine or tobacco, such as cigarettes, e-cigarettes, and chewing tobacco. If you need help quitting, ask your health care provider.  Get at least 8 hours of sleep every night.  Find ways to manage stress, such as meditation, deep breathing, or yoga. General instructions      Keep a journal to find out what may trigger your migraine headaches. For example, write down: ? What you eat and drink. ? How much sleep you get. ? Any change to your diet or medicines.  If you have a migraine headache: ? Avoid things that make your symptoms worse, such as bright lights. ? It may help to lie down in a dark, quiet room. ? Do not drive or use heavy machinery. ? Ask your health care provider what activities are safe for you while you are experiencing symptoms.  Keep all follow-up visits as told by your health care provider. This is important. Contact a health care provider if:  You develop symptoms that are different or more severe than your usual migraine headache symptoms.  You have more than 15 headache days in one month. Get help right away if:  Your migraine headache becomes severe.  Your migraine headache lasts longer than 72 hours.  You have a fever.  You have a stiff neck.  You have vision loss.  Your muscles feel weak or like you cannot control them.  You start to lose your balance often.  You have trouble walking.  You faint.  You have a seizure. Summary  A migraine headache is an intense, throbbing pain on one side or both sides of the head. Migraines may also cause other symptoms, such as nausea, vomiting, and sensitivity to light and noise.  This condition may be treated with medicines and lifestyle changes. You may also need to avoid certain things that trigger a migraine headache.  Keep a journal to find out what may trigger your migraine headaches.  Contact your health care provider if you have more than  15 headache days in a month or you develop symptoms that are different or more severe than your usual migraine headache symptoms. This information is not intended to replace advice given to you by your health care provider. Make sure you discuss any questions you have with your health care provider. Document Revised: 01/14/2019 Document Reviewed: 11/04/2018 Elsevier Patient Education  [place].

## 2020-05-30 NOTE — Assessment & Plan Note (Signed)
New problem Has been having headaches since her 32's - occurring once a week No change in frequency, but now associated with nausea, photosensitivity, dec concentration ? Dad has headaches No known triggers - will keep a HA log and see if she can identify any triggers and work on prevention Discussed these headaches are typical for migraines Trial of ubrelvy She will let me know how this works - consider other medications if needed or referral to neurology

## 2020-06-06 ENCOUNTER — Other Ambulatory Visit: Payer: Self-pay | Admitting: *Deleted

## 2020-06-06 NOTE — Telephone Encounter (Signed)
Receive fax from [facility] for Ubrelyv 100 mg. Completed PA on cover-meds.  (KeyMallie Darting) Rx #: [id number]. Waiting on insurance approval status.Marland KitchenJohny Chess

## 2020-06-07 NOTE — Telephone Encounter (Signed)
Rec'd PA back med has been denied. " It states Does not meet the clinical requirement for this medication. This decision is based on your plan's drug coverage policy for this medication".Marland KitchenJohny Chess

## 2020-06-07 NOTE — Telephone Encounter (Signed)
Let try one of the old migraine medications - they may cause some fatigue but most people do well with them.  Prescription of imitrex pending if she agrees.

## 2020-06-08 NOTE — Telephone Encounter (Signed)
Called pt, LVM.   

## 2020-08-27 ENCOUNTER — Emergency Department (HOSPITAL_COMMUNITY)
Admission: EM | Admit: 2020-08-27 | Discharge: 2020-08-27 | Disposition: A | Discharge status: Home / Self Care | Payer: BC Managed Care – PPO | Attending: Emergency Medicine | Admitting: Emergency Medicine

## 2020-08-27 ENCOUNTER — Telehealth: Payer: Self-pay | Admitting: Internal Medicine

## 2020-08-27 ENCOUNTER — Other Ambulatory Visit: Payer: Self-pay

## 2020-08-27 ENCOUNTER — Ambulatory Visit: Payer: BC Managed Care – PPO | Admitting: Internal Medicine

## 2020-08-27 ENCOUNTER — Encounter (HOSPITAL_COMMUNITY): Payer: Self-pay

## 2020-08-27 DIAGNOSIS — T782XXA Anaphylactic shock, unspecified, initial encounter: Secondary | ICD-10-CM | POA: Diagnosis not present

## 2020-08-27 DIAGNOSIS — T7840XA Allergy, unspecified, initial encounter: Secondary | ICD-10-CM | POA: Diagnosis not present

## 2020-08-27 LAB — BASIC METABOLIC PANEL
Anion gap: 13 (ref 5–15)
BUN: 13 mg/dL (ref 6–20)
CO2: 22 mmol/L (ref 22–32)
Calcium: 9.1 mg/dL (ref 8.9–10.3)
Chloride: 104 mmol/L (ref 98–111)
Creatinine, Ser: 0.87 mg/dL (ref 0.44–1.00)
GFR, Estimated: >60 mL/min (ref >60)
Glucose, Bld: 129 mg/dL — ABNORMAL HIGH (ref 70–99)
Potassium: 3.4 mmol/L — ABNORMAL LOW (ref 3.5–5.1)
Sodium: 139 mmol/L (ref 135–145)

## 2020-08-27 LAB — CBC
HCT: 45.4 % (ref 36.0–46.0)
Hemoglobin: 15.3 g/dL — ABNORMAL HIGH (ref 12.0–15.0)
MCH: 31.2 pg (ref 26.0–34.0)
MCHC: 33.7 g/dL (ref 30.0–36.0)
MCV: 92.7 fL (ref 80.0–100.0)
Platelets: 276 10*3/uL (ref 150–400)
RBC: 4.9 MIL/uL (ref 3.87–5.11)
RDW: 11.9 % (ref 11.5–15.5)
WBC: 18.7 10*3/uL — ABNORMAL HIGH (ref 4.0–10.5)
nRBC: 0 % (ref 0.0–0.2)

## 2020-08-27 MED ORDER — DIPHENHYDRAMINE HCL 50 MG/ML IJ SOLN
50.0000 mg | Freq: Once | INTRAMUSCULAR | Status: AC
  Administered 2020-08-27: 50 mg via INTRAVENOUS
  Filled 2020-08-27: qty 1

## 2020-08-27 MED ORDER — ALUM & MAG HYDROXIDE-SIMETH 200-200-20 MG/5ML PO SUSP
30.0000 mL | Freq: Once | ORAL | Status: AC
  Administered 2020-08-27: 30 mL via ORAL
  Filled 2020-08-27: qty 30

## 2020-08-27 MED ORDER — PREDNISONE 20 MG PO TABS: 40.0000 mg | ORAL_TABLET | Freq: Every day | ORAL | 0 refills | Status: DC

## 2020-08-27 MED ORDER — METHYLPREDNISOLONE SODIUM SUCC 125 MG IJ SOLR
125.0000 mg | Freq: Once | INTRAMUSCULAR | Status: AC
  Administered 2020-08-27: 125 mg via INTRAVENOUS
  Filled 2020-08-27: qty 2

## 2020-08-27 MED ORDER — FAMOTIDINE IN NACL 20-0.9 MG/50ML-% IV SOLN
20.0000 mg | Freq: Once | INTRAVENOUS | Status: AC
  Administered 2020-08-27: 20 mg via INTRAVENOUS
  Filled 2020-08-27: qty 50

## 2020-08-27 MED ORDER — HYDROXYZINE HCL 25 MG PO TABS: 25.0000 mg | ORAL_TABLET | Freq: Four times a day (QID) | ORAL | 0 refills | Status: DC

## 2020-08-27 MED ORDER — EPINEPHRINE 0.3 MG/0.3ML IJ SOAJ: 0.3000 mg | INTRAMUSCULAR | 0 refills | Status: DC | PRN

## 2020-08-27 MED ORDER — LIDOCAINE VISCOUS HCL 2 % MT SOLN
15.0000 mL | Freq: Once | OROMUCOSAL | Status: AC
  Administered 2020-08-27: 15 mL via ORAL
  Filled 2020-08-27: qty 15

## 2020-08-27 NOTE — Telephone Encounter (Signed)
Caller states she has a rash all over, dizziness and urination pain. Recommendation was to go to the ED. Patient went to the ED.
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
## 2020-08-27 NOTE — ED Triage Notes (Signed)
Pt from home with ems with an allergic reaction. Pt started with hives on her hands Saturday. Pt seen at [facility] and given benadryl and prednisone. Today her husband reports she has been more lethargic, pt c.o chest heaviness and difficulty swallowing. Bp initially 86 palpated. HR 80s. Pt given epi IM, 4mg  zofran IV and she took 50mg  benadryl orally PTA . Pt arrives to ED alert, airway intact

## 2020-08-27 NOTE — Discharge Instructions (Addendum)
Please take either Benadryl or Vistaril (hydroxyzine) every 6 hours as needed for itching related to this allergic reaction.  These medications are sedating, please be careful when you are taking them.  You should also take prednisone daily for 5 days  I have prescribed an EpiPen for you, if you develop a severe or worsening allergic reaction including swelling of the throat, worsening shortness of breath, vomiting or weakness please take the EpiPen and call 911.  Assuming that this gradually gets better over the next couple of days I would recommend that you follow-up with your family doctor, you will need formal allergy testing.

## 2020-08-27 NOTE — ED Notes (Signed)
Pt reports some mild burning in center chest, describes it as feeling like heart burn.

## 2020-08-29 ENCOUNTER — Ambulatory Visit: Payer: BC Managed Care – PPO | Admitting: Internal Medicine

## 2020-08-29 ENCOUNTER — Other Ambulatory Visit: Payer: Self-pay

## 2020-08-29 ENCOUNTER — Inpatient Hospital Stay (HOSPITAL_COMMUNITY)
Admission: EM | Admit: 2020-08-29 | Discharge: 2020-09-02 | DRG: 916 | Disposition: A | Discharge status: Home / Self Care | Payer: BC Managed Care – PPO | Attending: Internal Medicine | Admitting: Internal Medicine

## 2020-08-29 ENCOUNTER — Encounter: Payer: Self-pay | Admitting: Internal Medicine

## 2020-08-29 ENCOUNTER — Emergency Department (HOSPITAL_COMMUNITY): Payer: BC Managed Care – PPO

## 2020-08-29 ENCOUNTER — Telehealth: Payer: Self-pay | Admitting: Internal Medicine

## 2020-08-29 ENCOUNTER — Encounter (HOSPITAL_COMMUNITY): Payer: Self-pay | Admitting: Emergency Medicine

## 2020-08-29 VITALS — BP 116/74 | HR 88 | Temp 98.0°F | Ht 63.0 in | Wt 136.0 lb

## 2020-08-29 DIAGNOSIS — R739 Hyperglycemia, unspecified: Secondary | ICD-10-CM | POA: Diagnosis present

## 2020-08-29 DIAGNOSIS — T782XXA Anaphylactic shock, unspecified, initial encounter: Principal | ICD-10-CM | POA: Diagnosis present

## 2020-08-29 DIAGNOSIS — Z79899 Other long term (current) drug therapy: Secondary | ICD-10-CM

## 2020-08-29 DIAGNOSIS — E876 Hypokalemia: Secondary | ICD-10-CM | POA: Diagnosis present

## 2020-08-29 DIAGNOSIS — L508 Other urticaria: Secondary | ICD-10-CM | POA: Diagnosis present

## 2020-08-29 DIAGNOSIS — L509 Urticaria, unspecified: Secondary | ICD-10-CM

## 2020-08-29 DIAGNOSIS — Z7952 Long term (current) use of systemic steroids: Secondary | ICD-10-CM

## 2020-08-29 DIAGNOSIS — R21 Rash and other nonspecific skin eruption: Secondary | ICD-10-CM | POA: Diagnosis not present

## 2020-08-29 DIAGNOSIS — T380X5A Adverse effect of glucocorticoids and synthetic analogues, initial encounter: Secondary | ICD-10-CM | POA: Diagnosis present

## 2020-08-29 DIAGNOSIS — Z20822 Contact with and (suspected) exposure to covid-19: Secondary | ICD-10-CM | POA: Diagnosis present

## 2020-08-29 DIAGNOSIS — I959 Hypotension, unspecified: Secondary | ICD-10-CM | POA: Diagnosis present

## 2020-08-29 DIAGNOSIS — Z87892 Personal history of anaphylaxis: Secondary | ICD-10-CM

## 2020-08-29 DIAGNOSIS — Z91018 Allergy to other foods: Secondary | ICD-10-CM

## 2020-08-29 MED ORDER — METHYLPREDNISOLONE ACETATE 80 MG/ML IJ SUSP
80.0000 mg | Freq: Once | INTRAMUSCULAR | Status: AC
  Administered 2020-08-29: 80 mg via INTRAMUSCULAR

## 2020-08-29 MED ORDER — SODIUM CHLORIDE 0.9 % IV SOLN: INTRAVENOUS | Status: DC

## 2020-08-29 MED ORDER — DIPHENHYDRAMINE-ZINC ACETATE 2-0.1 % EX CREA
TOPICAL_CREAM | Freq: Three times a day (TID) | CUTANEOUS | Status: DC | PRN
  Filled 2020-08-29 (×3): qty 28

## 2020-08-29 MED ORDER — PREDNISONE 10 MG PO TABS: ORAL_TABLET | ORAL | 0 refills | Status: DC

## 2020-08-29 MED ORDER — FAMOTIDINE 40 MG PO TABS: 40.0000 mg | ORAL_TABLET | Freq: Every day | ORAL | 0 refills | Status: DC

## 2020-08-29 MED ORDER — METHYLPREDNISOLONE ACETATE 40 MG/ML IJ SUSP
40.0000 mg | Freq: Once | INTRAMUSCULAR | Status: AC
  Administered 2020-08-29: 40 mg via INTRAMUSCULAR

## 2020-08-29 MED ORDER — SODIUM CHLORIDE 0.9 % IV BOLUS
1000.0000 mL | Freq: Once | INTRAVENOUS | Status: AC
Start: 2020-08-30 — End: 2020-08-30
  Administered 2020-08-29: 1000 mL via INTRAVENOUS

## 2020-08-29 MED ORDER — FAMOTIDINE IN NACL 20-0.9 MG/50ML-% IV SOLN
20.0000 mg | Freq: Once | INTRAVENOUS | Status: AC
Start: 2020-08-30 — End: 2020-08-30
  Administered 2020-08-29: 20 mg via INTRAVENOUS
  Filled 2020-08-29: qty 50

## 2020-08-29 NOTE — Telephone Encounter (Signed)
Patient coming today

## 2020-08-29 NOTE — Telephone Encounter (Signed)
    Patient calling to report nausea, chest tightness and rash all over  Call transferred to [facility]

## 2020-08-29 NOTE — Telephone Encounter (Signed)
Kelly Figueroa with [facility] called and said that the patient has chest tightness and a rash all over. She doesn't have any SOB. She was recommended to go to the ER but she declined. Please call the patient back at [phone]
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
## 2020-08-29 NOTE — Patient Instructions (Addendum)
You received a steroid injection today.    Take pepcid 40 mg daily  Continue benadryl nightly.   Continue hydroxyzine 25 mg every 6 hours during the day for itch.   Prednisone taper - start tomorrow.    Please call if there is no improvement in your symptoms.

## 2020-08-29 NOTE — Assessment & Plan Note (Signed)
Acute No obvious cause-she cannot pinpoint anything new or anything different She does have a number for an allergist and will call them today She was taken into the ED 2 days ago for anaphylaxis-symptoms improved after treatment in the emergency room, but seem to be getting worse Depo-Medrol 120 mg IM here Prednisone taper starting tomorrow 60 mg x 3 days, 40 mg x 3 days, 30 mg x 3 days, 20 mg x 3 days and 10 mg x 3 days Continue Benadryl at night-can take during the day if needed Continue hydroxyzine 25 mg every 6 hours as needed for itching Start Pepcid 40 mg daily If symptoms worsen advised her to call
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
## 2020-08-29 NOTE — ED Triage Notes (Signed)
Pt BIB [facility] from home, c/o allergic reaction and hives x 1 week. Hives to extremities, back and chest, states they have worsened today. Currently taking a prednisone taper. Given 0.3mg  epi IM, 50mg  benadryl IV, and 4mg  zofran IV. Pt reports improvement after meds.

## 2020-08-30 DIAGNOSIS — L508 Other urticaria: Secondary | ICD-10-CM | POA: Diagnosis not present

## 2020-08-30 DIAGNOSIS — L509 Urticaria, unspecified: Secondary | ICD-10-CM

## 2020-08-30 LAB — COMPREHENSIVE METABOLIC PANEL
ALT: 14 U/L (ref 0–44)
AST: 23 U/L (ref 15–41)
Albumin: 3 g/dL — ABNORMAL LOW (ref 3.5–5.0)
Alkaline Phosphatase: 40 U/L (ref 38–126)
Anion gap: 7 (ref 5–15)
BUN: 10 mg/dL (ref 6–20)
CO2: 25 mmol/L (ref 22–32)
Calcium: 7.9 mg/dL — ABNORMAL LOW (ref 8.9–10.3)
Chloride: 105 mmol/L (ref 98–111)
Creatinine, Ser: 0.88 mg/dL (ref 0.44–1.00)
GFR, Estimated: >60 mL/min (ref >60)
Glucose, Bld: 182 mg/dL — ABNORMAL HIGH (ref 70–99)
Potassium: 3.2 mmol/L — ABNORMAL LOW (ref 3.5–5.1)
Sodium: 137 mmol/L (ref 135–145)
Total Bilirubin: 0.6 mg/dL (ref 0.3–1.2)
Total Protein: 5.2 g/dL — ABNORMAL LOW (ref 6.5–8.1)

## 2020-08-30 LAB — HIV ANTIBODY (ROUTINE TESTING W REFLEX): HIV Screen 4th Generation wRfx: NONREACTIVE

## 2020-08-30 LAB — CBC WITH DIFFERENTIAL/PLATELET
Abs Immature Granulocytes: 0 10*3/uL (ref 0.00–0.07)
Abs Immature Granulocytes: 0.02 10*3/uL (ref 0.00–0.07)
Basophils Absolute: 0 10*3/uL (ref 0.0–0.1)
Basophils Absolute: 0 10*3/uL (ref 0.0–0.1)
Basophils Relative: 0 %
Basophils Relative: 0 %
Eosinophils Absolute: 0 10*3/uL (ref 0.0–0.5)
Eosinophils Absolute: 0.1 10*3/uL (ref 0.0–0.5)
Eosinophils Relative: 0 %
Eosinophils Relative: 1 %
HCT: 40 % (ref 36.0–46.0)
HCT: 41 % (ref 36.0–46.0)
Hemoglobin: 13.6 g/dL (ref 12.0–15.0)
Hemoglobin: 13.7 g/dL (ref 12.0–15.0)
Immature Granulocytes: 0 %
Lymphocytes Relative: 10 %
Lymphocytes Relative: 23 %
Lymphs Abs: 0.8 10*3/uL (ref 0.7–4.0)
Lymphs Abs: 1.4 10*3/uL (ref 0.7–4.0)
MCH: 30.6 pg (ref 26.0–34.0)
MCH: 30.6 pg (ref 26.0–34.0)
MCHC: 33.2 g/dL (ref 30.0–36.0)
MCHC: 34.3 g/dL (ref 30.0–36.0)
MCV: 89.5 fL (ref 80.0–100.0)
MCV: 92.3 fL (ref 80.0–100.0)
Monocytes Absolute: 0.1 10*3/uL (ref 0.1–1.0)
Monocytes Absolute: 0.1 10*3/uL (ref 0.1–1.0)
Monocytes Relative: 1 %
Monocytes Relative: 2 %
Neutro Abs: 4.5 10*3/uL (ref 1.7–7.7)
Neutro Abs: 7.1 10*3/uL (ref 1.7–7.7)
Neutrophils Relative %: 74 %
Neutrophils Relative %: 89 %
Platelets: 207 10*3/uL (ref 150–400)
Platelets: 213 10*3/uL (ref 150–400)
RBC: 4.44 MIL/uL (ref 3.87–5.11)
RBC: 4.47 MIL/uL (ref 3.87–5.11)
RDW: 12 % (ref 11.5–15.5)
RDW: 12 % (ref 11.5–15.5)
WBC: 6.1 10*3/uL (ref 4.0–10.5)
WBC: 8 10*3/uL (ref 4.0–10.5)
nRBC: 0 % (ref 0.0–0.2)
nRBC: 0 % (ref 0.0–0.2)
nRBC: 0 /100 WBC

## 2020-08-30 LAB — I-STAT CHEM 8, ED
BUN: 10 mg/dL (ref 6–20)
Calcium, Ion: 1 mmol/L — ABNORMAL LOW (ref 1.15–1.40)
Chloride: 101 mmol/L (ref 98–111)
Creatinine, Ser: 0.7 mg/dL (ref 0.44–1.00)
Glucose, Bld: 176 mg/dL — ABNORMAL HIGH (ref 70–99)
HCT: 39 % (ref 36.0–46.0)
Hemoglobin: 13.3 g/dL (ref 12.0–15.0)
Potassium: 3.1 mmol/L — ABNORMAL LOW (ref 3.5–5.1)
Sodium: 137 mmol/L (ref 135–145)
TCO2: 21 mmol/L — ABNORMAL LOW (ref 22–32)

## 2020-08-30 LAB — HEPATIC FUNCTION PANEL
ALT: 18 U/L (ref 0–44)
AST: 26 U/L (ref 15–41)
Albumin: 3 g/dL — ABNORMAL LOW (ref 3.5–5.0)
Alkaline Phosphatase: 39 U/L (ref 38–126)
Bilirubin, Direct: 0.1 mg/dL (ref 0.0–0.2)
Indirect Bilirubin: 0.8 mg/dL (ref 0.3–0.9)
Total Bilirubin: 0.9 mg/dL (ref 0.3–1.2)
Total Protein: 5.4 g/dL — ABNORMAL LOW (ref 6.5–8.1)

## 2020-08-30 LAB — URINALYSIS, ROUTINE W REFLEX MICROSCOPIC
Bilirubin Urine: NEGATIVE
Glucose, UA: NEGATIVE mg/dL
Hgb urine dipstick: NEGATIVE
Ketones, ur: NEGATIVE mg/dL
Leukocytes,Ua: NEGATIVE
Nitrite: NEGATIVE
Protein, ur: NEGATIVE mg/dL
Specific Gravity, Urine: 1.01 (ref 1.005–1.030)
pH: 6 (ref 5.0–8.0)

## 2020-08-30 LAB — BASIC METABOLIC PANEL
Anion gap: 11 (ref 5–15)
BUN: 7 mg/dL (ref 6–20)
CO2: 22 mmol/L (ref 22–32)
Calcium: 8 mg/dL — ABNORMAL LOW (ref 8.9–10.3)
Chloride: 105 mmol/L (ref 98–111)
Creatinine, Ser: 0.85 mg/dL (ref 0.44–1.00)
GFR, Estimated: >60 mL/min (ref >60)
Glucose, Bld: 121 mg/dL — ABNORMAL HIGH (ref 70–99)
Potassium: 3.4 mmol/L — ABNORMAL LOW (ref 3.5–5.1)
Sodium: 138 mmol/L (ref 135–145)

## 2020-08-30 LAB — SEDIMENTATION RATE: Sed Rate: 5 mm/hr (ref 0–22)

## 2020-08-30 LAB — I-STAT BETA HCG BLOOD, ED (MC, WL, AP ONLY): I-stat hCG, quantitative: <5 m[IU]/mL (ref <5)

## 2020-08-30 LAB — LACTIC ACID, PLASMA
Lactic Acid, Venous: 1 mmol/L (ref 0.5–1.9)
Lactic Acid, Venous: 0.8 mmol/L (ref 0.5–1.9)

## 2020-08-30 LAB — RESP PANEL BY RT-PCR (FLU A&B, COVID) ARPGX2
Influenza A by PCR: NEGATIVE
Influenza B by PCR: NEGATIVE
SARS Coronavirus 2 by RT PCR: NEGATIVE

## 2020-08-30 LAB — MAGNESIUM: Magnesium: 1.6 mg/dL — ABNORMAL LOW (ref 1.7–2.4)

## 2020-08-30 MED ORDER — ACETAMINOPHEN 325 MG PO TABS
650.0000 mg | ORAL_TABLET | Freq: Four times a day (QID) | ORAL | Status: DC | PRN
  Administered 2020-08-30 – 2020-08-31 (×2): 650 mg via ORAL
  Filled 2020-08-30 (×2): qty 2

## 2020-08-30 MED ORDER — DIPHENHYDRAMINE HCL 50 MG/ML IJ SOLN
25.0000 mg | Freq: Once | INTRAMUSCULAR | Status: AC
  Administered 2020-08-30: 25 mg via INTRAVENOUS
  Filled 2020-08-30: qty 1

## 2020-08-30 MED ORDER — MAGNESIUM SULFATE 2 GM/50ML IV SOLN
2.0000 g | Freq: Once | INTRAVENOUS | Status: AC
  Administered 2020-08-30: 2 g via INTRAVENOUS
  Filled 2020-08-30: qty 50

## 2020-08-30 MED ORDER — METHYLPREDNISOLONE SODIUM SUCC 40 MG IJ SOLR: 40.0000 mg | Freq: Every day | INTRAMUSCULAR | Status: DC

## 2020-08-30 MED ORDER — DIPHENHYDRAMINE HCL 50 MG/ML IJ SOLN
INTRAMUSCULAR | Status: AC
  Filled 2020-08-30: qty 1

## 2020-08-30 MED ORDER — POTASSIUM CHLORIDE CRYS ER 20 MEQ PO TBCR
20.0000 meq | EXTENDED_RELEASE_TABLET | Freq: Once | ORAL | Status: AC
  Administered 2020-08-30: 20 meq via ORAL
  Filled 2020-08-30: qty 1

## 2020-08-30 MED ORDER — DIPHENHYDRAMINE HCL 50 MG/ML IJ SOLN
25.0000 mg | Freq: Once | INTRAMUSCULAR | Status: AC
  Administered 2020-08-30: 25 mg via INTRAVENOUS

## 2020-08-30 MED ORDER — POTASSIUM CHLORIDE 20 MEQ PO PACK
40.0000 meq | PACK | Freq: Once | ORAL | Status: DC
  Filled 2020-08-30: qty 2

## 2020-08-30 MED ORDER — SODIUM CHLORIDE 0.9 % IV SOLN: INTRAVENOUS | Status: AC

## 2020-08-30 MED ORDER — PREDNISONE 20 MG PO TABS
20.0000 mg | ORAL_TABLET | Freq: Two times a day (BID) | ORAL | Status: DC
  Administered 2020-08-30 – 2020-08-31 (×3): 20 mg via ORAL
  Filled 2020-08-30 (×3): qty 1

## 2020-08-30 MED ORDER — FENTANYL CITRATE (PF) 100 MCG/2ML IJ SOLN
50.0000 ug | Freq: Once | INTRAMUSCULAR | Status: AC
  Administered 2020-08-30: 50 ug via INTRAVENOUS
  Filled 2020-08-30: qty 2

## 2020-08-30 MED ORDER — DEXAMETHASONE SODIUM PHOSPHATE 10 MG/ML IJ SOLN
10.0000 mg | Freq: Once | INTRAMUSCULAR | Status: AC
  Administered 2020-08-30: 10 mg via INTRAVENOUS
  Filled 2020-08-30: qty 1

## 2020-08-30 MED ORDER — LORATADINE 10 MG PO TABS
10.0000 mg | ORAL_TABLET | Freq: Every day | ORAL | Status: DC
  Administered 2020-08-30 – 2020-09-02 (×4): 10 mg via ORAL
  Filled 2020-08-30 (×4): qty 1

## 2020-08-30 MED ORDER — DIPHENHYDRAMINE HCL 25 MG PO CAPS
25.0000 mg | ORAL_CAPSULE | ORAL | Status: DC | PRN
  Administered 2020-08-30 – 2020-08-31 (×5): 25 mg via ORAL
  Filled 2020-08-30 (×5): qty 1

## 2020-08-30 MED ORDER — FAMOTIDINE IN NACL 20-0.9 MG/50ML-% IV SOLN
20.0000 mg | Freq: Two times a day (BID) | INTRAVENOUS | Status: DC
  Administered 2020-08-30 – 2020-09-01 (×5): 20 mg via INTRAVENOUS
  Filled 2020-08-30 (×6): qty 50

## 2020-08-30 MED ORDER — MORPHINE SULFATE (PF) 2 MG/ML IV SOLN
2.0000 mg | INTRAVENOUS | Status: DC | PRN
  Administered 2020-08-30 – 2020-09-01 (×3): 2 mg via INTRAVENOUS
  Filled 2020-08-30 (×4): qty 1

## 2020-08-30 NOTE — Progress Notes (Signed)
Called by RN that rapid response called for patient due to lip swelling that started approximately 30-40 minutes ago and has gotten progressively more prominent.  Went to bedside and evaluated patient.  Ears, chest, abdomen and extremities.  Patient with swelling of the left side of her upper lip.  Speech is normal.  Patient was admitted with urticaria possible anaphylaxis.  She was given Benadryl a short time ago.  And Pepcid doses hanging now.  Patient been on prednisone since admission. She has no change in her voice, respiratory distress or difficulty swallowing.  Is handling oral secretions without difficulty.  Given extra dose of Benadryl 25 mg IV now.  Given dose of Decadron 10 mg IV now. Continue to monitor closely.
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
## 2020-08-30 NOTE — Progress Notes (Signed)
Patient called to state that she was having increased itching. Upon assessment, patient's skin red, hot to touch. No fever. Swelling of the lips, more so on left. Rapid response to bedside. Dr. Tonie Griffith paged. Benadryl IV and decadron ordered and given. MD to bedside to assess.  Patient stated that she was feeling better after the benadryl/decadron. Will continue to monitor.

## 2020-08-30 NOTE — Significant Event (Addendum)
Rapid Response Event Note   Reason for Call :  Lips swelling/hives/itching.  Benadryl 25mg  po given at 2043 for itching. Since this was given, her lips began to swell and she developed hives on trunk/head/arms.  Initial Focused Assessment:  Pt laying in bed in no respiratory distress. Pt is alert and oriented, denies chest pain/SOB/difficulty swallowing/itching to throat. Pt speech is clear. Her lips are very swollen, skin is reddened with hives present, and she is very itchy. Lungs are clear t/o. Skin is hot to touch.   T-98.4, HR-94, BP-109/59, RR-18, SpO2-95% on RA.   Interventions:  2200 dose of 20mg  pepcid IV given now PRN Benadryl cream applied Benadryl 25mg  IV X 1 Decadron 10mg  IV X 1  Plan of Care:  Symptoms resolving after interventions. Pt says she feels a lot better. Continue to monitor pt closely. Call RRT if further assistance needed.    Event Summary:   MD Notified: Dr. Tonie Griffith notified and came to bedside Call Time:2124(RRT was already on unit) [place], Emmelia Holdsworth Anderson, RN

## 2020-08-30 NOTE — Progress Notes (Signed)
PROGRESS NOTE    Unique Sillas  ELF:[id number] DOB: October 16, 1980 DOA: 08/29/2020 PCP: Binnie Rail, MD   Brief Narrative:  Patient is 39 year old female with no significant past medical history presents to emergency department about 3 days ago with persistent hives.  Patient symptoms started on August 25 2020 with hives in the periphery which started moving towards the trunk and involve the whole body eventually.  No difficulty breathing or tongue swelling or lip swelling or any mucosal ulceration.  Patient had itching all over the hives.  No pain over the hives.  No scarring after the hives resolved.  Denies taking any new medications or perfume or detergents.  Patient symptoms started when she was in [place] and when she came to [place] she came to the ER.  On November 22 patient was given epinephrine initially because of possible anaphylactic reaction by the EMS and in the ER was observed with steroids and H2 blockers and discharged home to follow-up with primary care physician.  Since then her hives have been coming in different places and had gone to her primary care physician yesterday was given steroid shot and was prescribed Pepcid along with hydroxyzine and prednisone 40 mg a tapering dose.  Since hives is getting worse and also had some subjective feeling of fever chills patient decided to come to the ER.  In ED: Patient was noticed to have multiple hives particularly on the trunk and upper back.  No wheezing or stridor.  Patient was given epinephrine because her blood pressure was low by EMS.  In the ER lab work shows blood glucose of 182, potassium of 3.2, no leukocytosis, UA negative, chest x-ray nothing acute except for round density which may be artifact from EKG leads.  Patient had fever of 100.7.  Patient started on steroid, Pepcid and Benadryl and admitted for further management.  Assessment & Plan:   Acute urticaria with possible anaphylaxis: -Patient was given  epinephrine shot by the EMS on the way to the ER. -Patient had fever of 100.7, no leukocytosis, lactic acid: WNL.  Reviewed chest x-ray. -Has urticarial lesions all over her body especially on upper back, legs. -Continue IV fluids, loratadine, prednisone and Pepcid. -Blood culture is pending.  Sed rate: WNL, LFTs: WNL, UA negative.  COVID-19 negative. -She has follow-up appointment with allergy specialist in January 1 week. -Continue monitor his vitals, oxygen saturation.  Hypomagnesemia: Replenished.  Repeat magnesium level tomorrow AM.  Hypokalemia: Replenished.  Repeat BMP tomorrow a.m.  DVT prophylaxis: SCD  code Status: Full code Family Communication:  None present at bedside.  Plan of care discussed with patient in length and she verbalized understanding and agreed with it. Disposition Plan: Likely home tomorrow  Consultants:   None  Procedures:   None  Antimicrobials:   None  Status is: Observation  Dispo: The patient is from: Home              Anticipated d/c is to: Home              Anticipated d/c date is: 1 day              Patient currently is not medically stable to d/c.    Subjective: Patient seen and examined.  Tells me that she feels better however she continues to have rash on her upper back and legs which are itchy.  She denies current lip swelling, shortness of breath, wheezing, chest pain.  Tells me that she is scared to eat  anything.  Objective: Vitals:   08/30/20 0531 08/30/20 0714 08/30/20 0800 08/30/20 0925  BP: 111/62 (!) 102/54 98/60 106/64  Pulse: (!) 105 90 80 87  Resp: 17 18 18 18   Temp: (!) 100.7 F (38.2 C) 99 F (37.2 C) 98.4 F (36.9 C) 98.4 F (36.9 C)  TempSrc: Oral Oral Oral Oral  SpO2: 100% 97% 95% 96%  Weight: 63.4 kg     Height: 5\' 3"  (1.6 m)       Intake/Output Summary (Last 24 hours) at 08/30/2020 1126 Last data filed at 08/30/2020 1012 Gross per 24 hour  Intake 110 ml  Output --  Net 110 ml   Filed Weights    08/30/20 0531  Weight: 63.4 kg    Examination:  General exam: Appears calm and comfortable, on room air, communicating well, not appears to be in distress. Respiratory system: Clear to auscultation. Respiratory effort normal. Cardiovascular system: S1 & S2 heard, RRR. No JVD, murmurs, rubs, gallops or clicks. No pedal edema. Gastrointestinal system: Abdomen is nondistended, soft and nontender. No organomegaly or masses felt. Normal bowel sounds heard. Central nervous system: Alert and oriented. No focal neurological deficits. Extremities: Symmetric 5 x 5 power. Skin: Multiple urticarial rash which is blanchable, raised noted on upper back, legs Psychiatry: Judgement and insight appear normal. Mood & affect appropriate.    Data Reviewed: I have personally reviewed following labs and imaging studies  CBC: Recent Labs  Lab 08/27/20 1135 08/29/20 2346 08/30/20 0019 08/30/20 0549  WBC 18.7* 8.0  --  6.1  NEUTROABS  --  7.1  --  4.5  HGB 15.3* 13.6 13.3 13.7  HCT 45.4 41.0 39.0 40.0  MCV 92.7 92.3  --  89.5  PLT 276 207  --  016   Basic Metabolic Panel: Recent Labs  Lab 08/27/20 1135 08/29/20 2354 08/30/20 0019 08/30/20 0549 08/30/20 0722  NA 139 137 137 138  --   K 3.4* 3.2* 3.1* 3.4*  --   CL 104 105 101 105  --   CO2 22 25  --  22  --   GLUCOSE 129* 182* 176* 121*  --   BUN 13 10 10 7   --   CREATININE 0.87 0.88 0.70 0.85  --   CALCIUM 9.1 7.9*  --  8.0*  --   MG  --   --   --   --  1.6*   GFR: Estimated Creatinine Clearance: 78.1 mL/min (by C-G formula based on SCr of 0.85 mg/dL). Liver Function Tests: Recent Labs  Lab 08/29/20 2354 08/30/20 0549  AST 23 26  ALT 14 18  ALKPHOS 40 39  BILITOT 0.6 0.9  PROT 5.2* 5.4*  ALBUMIN 3.0* 3.0*   No results for input(s): LIPASE, AMYLASE in the last 168 hours. No results for input(s): AMMONIA in the last 168 hours. Coagulation Profile: No results for input(s): INR, PROTIME in the last 168 hours. Cardiac  Enzymes: No results for input(s): CKTOTAL, CKMB, CKMBINDEX, TROPONINI in the last 168 hours. BNP (last 3 results) No results for input(s): PROBNP in the last 8760 hours. HbA1C: No results for input(s): HGBA1C in the last 72 hours. CBG: No results for input(s): GLUCAP in the last 168 hours. Lipid Profile: No results for input(s): CHOL, HDL, LDLCALC, TRIG, CHOLHDL, LDLDIRECT in the last 72 hours. Thyroid Function Tests: No results for input(s): TSH, T4TOTAL, FREET4, T3FREE, THYROIDAB in the last 72 hours. Anemia Panel: No results for input(s): VITAMINB12, FOLATE, FERRITIN, TIBC, IRON, RETICCTPCT in  the last 72 hours. Sepsis Labs: Recent Labs  Lab 08/30/20 0549 08/30/20 0817  LATICACIDVEN 1.0 0.8    Recent Results (from the past 240 hour(s))  Resp Panel by RT-PCR (Flu A&B, Covid) Nasopharyngeal Swab     Status: None   Collection Time: 08/30/20 12:00 AM   Specimen: Nasopharyngeal Swab; Nasopharyngeal(NP) swabs in vial transport medium  Result Value Ref Range Status   SARS Coronavirus 2 by RT PCR NEGATIVE NEGATIVE Final    Comment: (NOTE) SARS-CoV-2 target nucleic acids are NOT DETECTED.  The SARS-CoV-2 RNA is generally detectable in upper respiratory specimens during the acute phase of infection. The lowest concentration of SARS-CoV-2 viral copies this assay can detect is 138 copies/mL. A negative result does not preclude SARS-Cov-2 infection and should not be used as the sole basis for treatment or other patient management decisions. A negative result may occur with  improper specimen collection/handling, submission of specimen other than nasopharyngeal swab, presence of viral mutation(s) within the areas targeted by this assay, and inadequate number of viral copies(<138 copies/mL). A negative result must be combined with clinical observations, patient history, and epidemiological information. The expected result is Negative.  Fact Sheet for Patients:   EntrepreneurPulse.com.au  Fact Sheet for Healthcare Providers:  IncredibleEmployment.be  This test is no t yet approved or cleared by the [place] FDA and  has been authorized for detection and/or diagnosis of SARS-CoV-2 by FDA under an Emergency Use Authorization (EUA). This EUA will remain  in effect (meaning this test can be used) for the duration of the COVID-19 declaration under Section 564(b)(1) of the Act, 21 U.S.C.section 360bbb-3(b)(1), unless the authorization is terminated  or revoked sooner.       Influenza A by PCR NEGATIVE NEGATIVE Final   Influenza B by PCR NEGATIVE NEGATIVE Final    Comment: (NOTE) The Xpert Xpress SARS-CoV-2/FLU/RSV plus assay is intended as an aid in the diagnosis of influenza from Nasopharyngeal swab specimens and should not be used as a sole basis for treatment. Nasal washings and aspirates are unacceptable for Xpert Xpress SARS-CoV-2/FLU/RSV testing.  Fact Sheet for Patients: EntrepreneurPulse.com.au  Fact Sheet for Healthcare Providers: IncredibleEmployment.be  This test is not yet approved or cleared by the [place] FDA and has been authorized for detection and/or diagnosis of SARS-CoV-2 by FDA under an Emergency Use Authorization (EUA). This EUA will remain in effect (meaning this test can be used) for the duration of the COVID-19 declaration under Section 564(b)(1) of the Act, 21 U.S.C. section 360bbb-3(b)(1), unless the authorization is terminated or revoked.  Performed at [facility], [place] [address], [place], [place] [address]       Radiology Studies: DG Chest Port 1 View  Result Date: 08/30/2020 CLINICAL DATA:  Shortness of breath EXAM: PORTABLE CHEST 1 VIEW COMPARISON:  05/29/2016 FINDINGS: There is no acute cardiopulmonary process. The heart size is unremarkable. There is no pneumothorax or large pleural effusion. No focal infiltrate.  No acute osseous abnormality. There is a rounded density immediately inferior to the left EKG lead. IMPRESSION: 1. No definite acute cardiopulmonary process. 2. Rounded density inferior to the left EKG lead. This may be artifact from the EKG lead itself or represent a small pulmonary nodule. A repeat two-view chest x-ray is recommended with removal of the EKG lead. Electronically Signed   By: Constance Holster M.D.   On: 08/30/2020 00:11    Scheduled Meds: . loratadine  10 mg Oral Daily  . potassium chloride  40 mEq  Oral Once  . predniSONE  20 mg Oral BID WC   Continuous Infusions: . sodium chloride 100 mL/hr at 08/30/20 0554  . famotidine (PEPCID) IV 20 mg (08/30/20 0830)     LOS: 0 days   Time spent: 35 minutes   Roizy Harold Loann Quill, MD [facility]  If 7PM-7AM, please contact night-coverage [email or web address] 08/30/2020, 11:26 AM

## 2020-08-31 DIAGNOSIS — E876 Hypokalemia: Secondary | ICD-10-CM | POA: Diagnosis present

## 2020-08-31 DIAGNOSIS — Z7952 Long term (current) use of systemic steroids: Secondary | ICD-10-CM | POA: Diagnosis not present

## 2020-08-31 DIAGNOSIS — Z87892 Personal history of anaphylaxis: Secondary | ICD-10-CM | POA: Diagnosis not present

## 2020-08-31 DIAGNOSIS — Z79899 Other long term (current) drug therapy: Secondary | ICD-10-CM | POA: Diagnosis not present

## 2020-08-31 DIAGNOSIS — Z20822 Contact with and (suspected) exposure to covid-19: Secondary | ICD-10-CM | POA: Diagnosis present

## 2020-08-31 DIAGNOSIS — R739 Hyperglycemia, unspecified: Secondary | ICD-10-CM | POA: Diagnosis present

## 2020-08-31 DIAGNOSIS — L509 Urticaria, unspecified: Secondary | ICD-10-CM | POA: Diagnosis present

## 2020-08-31 DIAGNOSIS — Z91018 Allergy to other foods: Secondary | ICD-10-CM | POA: Diagnosis not present

## 2020-08-31 DIAGNOSIS — I959 Hypotension, unspecified: Secondary | ICD-10-CM | POA: Diagnosis present

## 2020-08-31 DIAGNOSIS — T380X5A Adverse effect of glucocorticoids and synthetic analogues, initial encounter: Secondary | ICD-10-CM | POA: Diagnosis present

## 2020-08-31 DIAGNOSIS — T782XXA Anaphylactic shock, unspecified, initial encounter: Secondary | ICD-10-CM | POA: Diagnosis present

## 2020-08-31 LAB — BASIC METABOLIC PANEL
Anion gap: 6 (ref 5–15)
BUN: 5 mg/dL — ABNORMAL LOW (ref 6–20)
CO2: 22 mmol/L (ref 22–32)
Calcium: 8.3 mg/dL — ABNORMAL LOW (ref 8.9–10.3)
Chloride: 109 mmol/L (ref 98–111)
Creatinine, Ser: 0.56 mg/dL (ref 0.44–1.00)
GFR, Estimated: >60 mL/min (ref >60)
Glucose, Bld: 150 mg/dL — ABNORMAL HIGH (ref 70–99)
Potassium: 4.2 mmol/L (ref 3.5–5.1)
Sodium: 137 mmol/L (ref 135–145)

## 2020-08-31 LAB — CBC
HCT: 35.2 % — ABNORMAL LOW (ref 36.0–46.0)
Hemoglobin: 12.1 g/dL (ref 12.0–15.0)
MCH: 31.2 pg (ref 26.0–34.0)
MCHC: 34.4 g/dL (ref 30.0–36.0)
MCV: 90.7 fL (ref 80.0–100.0)
Platelets: 189 10*3/uL (ref 150–400)
RBC: 3.88 MIL/uL (ref 3.87–5.11)
RDW: 12 % (ref 11.5–15.5)
WBC: 8.4 10*3/uL (ref 4.0–10.5)
nRBC: 0 % (ref 0.0–0.2)

## 2020-08-31 LAB — MAGNESIUM: Magnesium: 2.1 mg/dL (ref 1.7–2.4)

## 2020-08-31 MED ORDER — HYDROXYZINE HCL 10 MG PO TABS
10.0000 mg | ORAL_TABLET | Freq: Three times a day (TID) | ORAL | Status: DC | PRN
  Administered 2020-08-31 (×2): 10 mg via ORAL
  Filled 2020-08-31 (×3): qty 1

## 2020-08-31 MED ORDER — DIPHENHYDRAMINE HCL 50 MG/ML IJ SOLN
INTRAMUSCULAR | Status: AC
  Administered 2020-08-31: 50 mg
  Filled 2020-08-31: qty 1

## 2020-08-31 MED ORDER — HYDROXYZINE HCL 10 MG PO TABS
10.0000 mg | ORAL_TABLET | Freq: Four times a day (QID) | ORAL | Status: DC | PRN
  Administered 2020-09-01 – 2020-09-02 (×5): 10 mg via ORAL
  Filled 2020-08-31 (×7): qty 1

## 2020-08-31 MED ORDER — CALAMINE EX LOTN
TOPICAL_LOTION | Freq: Three times a day (TID) | CUTANEOUS | Status: DC
  Filled 2020-08-31: qty 177

## 2020-08-31 MED ORDER — METHYLPREDNISOLONE SODIUM SUCC 125 MG IJ SOLR
60.0000 mg | Freq: Four times a day (QID) | INTRAMUSCULAR | Status: DC
  Administered 2020-08-31 – 2020-09-02 (×9): 60 mg via INTRAVENOUS
  Filled 2020-08-31 (×9): qty 2

## 2020-08-31 NOTE — Plan of Care (Signed)
  Problem: Activity: Goal: Risk for activity intolerance will decrease Outcome: Progressing   

## 2020-08-31 NOTE — Progress Notes (Addendum)
PROGRESS NOTE    Kelly Figueroa  FXT:[id number] DOB: May 25, 1981 DOA: 08/29/2020 PCP: Binnie Rail, MD   Brief Narrative:  Patient is 39 year old female with no significant past medical history presents to emergency department about 3 days ago with persistent hives.  Patient symptoms started on August 25 2020 with hives in the periphery which started moving towards the trunk and involve the whole body eventually.  No difficulty breathing or tongue swelling or lip swelling or any mucosal ulceration.  Patient had itching all over the hives.  No pain over the hives.  No scarring after the hives resolved.  Denies taking any new medications or perfume or detergents.  Patient symptoms started when she was in [place] and when she came to [place] she came to the ER.  On November 22 patient was given epinephrine initially because of possible anaphylactic reaction by the EMS and in the ER was observed with steroids and H2 blockers and discharged home to follow-up with primary care physician.  Since then her hives have been coming in different places and had gone to her primary care physician yesterday was given steroid shot and was prescribed Pepcid along with hydroxyzine and prednisone 40 mg a tapering dose.  Since hives is getting worse and also had some subjective feeling of fever chills patient decided to come to the ER.  In ED: Patient was noticed to have multiple hives particularly on the trunk and upper back.  No wheezing or stridor.  Patient was given epinephrine because her blood pressure was low by EMS.  In the ER lab work shows blood glucose of 182, potassium of 3.2, no leukocytosis, UA negative, chest x-ray nothing acute except for round density which may be artifact from EKG leads.  Patient had fever of 100.7.  Patient started on steroid, Pepcid and Benadryl and admitted for further management.  Assessment & Plan:   Acute urticaria with possible anaphylaxis: -Patient was given  epinephrine shot by the EMS on the way to the ER. -Upon admission: Patient had fever of 100.7, no leukocytosis, lactic acid: WNL.  Reviewed chest x-ray. -Sed rate: WNL, LFTs: WNL, UA negative.  COVID-19 negative. -Continue IV fluids, Benadryl and Pepcid.   -Patient symptoms not improving-continue to have itching- change p.o. prednisone to IV Solu-Medrol.  Added hydroxyzine as needed for itching. -She has follow-up appointment with allergy specialist in January 1 week. -Continue monitor his vitals, oxygen saturation. -Advance diet  Hypomagnesemia: Replenished.  Repeat magnesium level: WNL  Hypokalemia: Replenished.  Repeat potassium level: WNL  Hyperglycemia: Likely in the setting of steroid use.  Continue to monitor.  DVT prophylaxis: SCD  code Status: Full code Family Communication:  None present at bedside.  Plan of care discussed with patient in length and she verbalized understanding and agreed with it. Disposition Plan: Home in 1 to 2 days Consultants:   None  Procedures:   None  Antimicrobials:   None  Status is: Inpatient Dispo: The patient is from: Home              Anticipated d/c is to: Home              Anticipated d/c date is: 1-2 days              Patient currently is not medically stable to d/c.    Subjective: Patient seen and examined.  Itchy all over her body especially in her legs, face and arms.  Tearful and concerned about her symptoms not going  away.  She denies shortness of breath, wheezing, chest pain, fever, chills, nausea, vomiting or abdominal pain. Able to tolerate clear liquid diet.  Rapid response was called last night due to lip swelling.  She was given extra dose of IV Benadryl and was given Decadron 10 mg and her symptoms improved.  Objective: Vitals:   08/30/20 2000 08/31/20 0046 08/31/20 0400 08/31/20 0848  BP: (!) 109/59 (!) 103/54 (!) 100/51 101/61  Pulse: 94 88 69 92  Resp: 18 18 18 16   Temp: 98.4 F (36.9 C) 98.2 F (36.8 C)  98.4 F (36.9 C) 98 F (36.7 C)  TempSrc: Oral Oral Oral Oral  SpO2: 95% 96% 96% 92%  Weight:      Height:        Intake/Output Summary (Last 24 hours) at 08/31/2020 0940 Last data filed at 08/30/2020 2351 Gross per 24 hour  Intake 1341.55 ml  Output 600 ml  Net 741.55 ml   Filed Weights   08/30/20 0531  Weight: 63.4 kg    Examination:  General exam: Appears calm and comfortable, on room air, communicating well, not appears to be in distress. Respiratory system: Clear to auscultation. Respiratory effort normal. Cardiovascular system: S1 & S2 heard, RRR. No JVD, murmurs, rubs, gallops or clicks. No pedal edema. Gastrointestinal system: Abdomen is nondistended, soft and nontender. No organomegaly or masses felt. Normal bowel sounds heard. Central nervous system: Alert and oriented. No focal neurological deficits. Extremities: Symmetric 5 x 5 power. Skin: Multiple urticarial erythematous rash which is blanchable, raised noted on upper back, legs, face and arms. Psychiatry: Judgement and insight appear normal. Mood & affect appropriate.    Data Reviewed: I have personally reviewed following labs and imaging studies  CBC: Recent Labs  Lab 08/27/20 1135 08/29/20 2346 08/30/20 0019 08/30/20 0549 08/31/20 0117  WBC 18.7* 8.0  --  6.1 8.4  NEUTROABS  --  7.1  --  4.5  --   HGB 15.3* 13.6 13.3 13.7 12.1  HCT 45.4 41.0 39.0 40.0 35.2*  MCV 92.7 92.3  --  89.5 90.7  PLT 276 207  --  213 527   Basic Metabolic Panel: Recent Labs  Lab 08/27/20 1135 08/29/20 2354 08/30/20 0019 08/30/20 0549 08/30/20 0722 08/31/20 0117  NA 139 137 137 138  --  137  K 3.4* 3.2* 3.1* 3.4*  --  4.2  CL 104 105 101 105  --  109  CO2 22 25  --  22  --  22  GLUCOSE 129* 182* 176* 121*  --  150*  BUN 13 10 10 7   --  5*  CREATININE 0.87 0.88 0.70 0.85  --  0.56  CALCIUM 9.1 7.9*  --  8.0*  --  8.3*  MG  --   --   --   --  1.6* 2.1   GFR: Estimated Creatinine Clearance: 83 mL/min (by C-G  formula based on SCr of 0.56 mg/dL). Liver Function Tests: Recent Labs  Lab 08/29/20 2354 08/30/20 0549  AST 23 26  ALT 14 18  ALKPHOS 40 39  BILITOT 0.6 0.9  PROT 5.2* 5.4*  ALBUMIN 3.0* 3.0*   No results for input(s): LIPASE, AMYLASE in the last 168 hours. No results for input(s): AMMONIA in the last 168 hours. Coagulation Profile: No results for input(s): INR, PROTIME in the last 168 hours. Cardiac Enzymes: No results for input(s): CKTOTAL, CKMB, CKMBINDEX, TROPONINI in the last 168 hours. BNP (last 3 results) No results for input(s): PROBNP  in the last 8760 hours. HbA1C: No results for input(s): HGBA1C in the last 72 hours. CBG: No results for input(s): GLUCAP in the last 168 hours. Lipid Profile: No results for input(s): CHOL, HDL, LDLCALC, TRIG, CHOLHDL, LDLDIRECT in the last 72 hours. Thyroid Function Tests: No results for input(s): TSH, T4TOTAL, FREET4, T3FREE, THYROIDAB in the last 72 hours. Anemia Panel: No results for input(s): VITAMINB12, FOLATE, FERRITIN, TIBC, IRON, RETICCTPCT in the last 72 hours. Sepsis Labs: Recent Labs  Lab 08/30/20 0549 08/30/20 0817  LATICACIDVEN 1.0 0.8    Recent Results (from the past 240 hour(s))  Resp Panel by RT-PCR (Flu A&B, Covid) Nasopharyngeal Swab     Status: None   Collection Time: 08/30/20 12:00 AM   Specimen: Nasopharyngeal Swab; Nasopharyngeal(NP) swabs in vial transport medium  Result Value Ref Range Status   SARS Coronavirus 2 by RT PCR NEGATIVE NEGATIVE Final    Comment: (NOTE) SARS-CoV-2 target nucleic acids are NOT DETECTED.  The SARS-CoV-2 RNA is generally detectable in upper respiratory specimens during the acute phase of infection. The lowest concentration of SARS-CoV-2 viral copies this assay can detect is 138 copies/mL. A negative result does not preclude SARS-Cov-2 infection and should not be used as the sole basis for treatment or other patient management decisions. A negative result may occur with    improper specimen collection/handling, submission of specimen other than nasopharyngeal swab, presence of viral mutation(s) within the areas targeted by this assay, and inadequate number of viral copies(<138 copies/mL). A negative result must be combined with clinical observations, patient history, and epidemiological information. The expected result is Negative.  Fact Sheet for Patients:  EntrepreneurPulse.com.au  Fact Sheet for Healthcare Providers:  IncredibleEmployment.be  This test is no t yet approved or cleared by the [place] FDA and  has been authorized for detection and/or diagnosis of SARS-CoV-2 by FDA under an Emergency Use Authorization (EUA). This EUA will remain  in effect (meaning this test can be used) for the duration of the COVID-19 declaration under Section 564(b)(1) of the Act, 21 U.S.C.section 360bbb-3(b)(1), unless the authorization is terminated  or revoked sooner.       Influenza A by PCR NEGATIVE NEGATIVE Final   Influenza B by PCR NEGATIVE NEGATIVE Final    Comment: (NOTE) The Xpert Xpress SARS-CoV-2/FLU/RSV plus assay is intended as an aid in the diagnosis of influenza from Nasopharyngeal swab specimens and should not be used as a sole basis for treatment. Nasal washings and aspirates are unacceptable for Xpert Xpress SARS-CoV-2/FLU/RSV testing.  Fact Sheet for Patients: EntrepreneurPulse.com.au  Fact Sheet for Healthcare Providers: IncredibleEmployment.be  This test is not yet approved or cleared by the [place] FDA and has been authorized for detection and/or diagnosis of SARS-CoV-2 by FDA under an Emergency Use Authorization (EUA). This EUA will remain in effect (meaning this test can be used) for the duration of the COVID-19 declaration under Section 564(b)(1) of the Act, 21 U.S.C. section 360bbb-3(b)(1), unless the authorization is terminated  or revoked.  Performed at [facility], [place] [address] [place], [place] [address]       Radiology Studies: DG Chest Port 1 View  Result Date: 08/30/2020 CLINICAL DATA:  Shortness of breath EXAM: PORTABLE CHEST 1 VIEW COMPARISON:  05/29/2016 FINDINGS: There is no acute cardiopulmonary process. The heart size is unremarkable. There is no pneumothorax or large pleural effusion. No focal infiltrate. No acute osseous abnormality. There is a rounded density immediately inferior to the left EKG lead. IMPRESSION: 1.  No definite acute cardiopulmonary process. 2. Rounded density inferior to the left EKG lead. This may be artifact from the EKG lead itself or represent a small pulmonary nodule. A repeat two-view chest x-ray is recommended with removal of the EKG lead. Electronically Signed   By: Constance Holster M.D.   On: 08/30/2020 00:11    Scheduled Meds: . loratadine  10 mg Oral Daily  . methylPREDNISolone (SOLU-MEDROL) injection  60 mg Intravenous Q6H  . potassium chloride  40 mEq Oral Once   Continuous Infusions: . famotidine (PEPCID) IV 20 mg (08/31/20 0902)     LOS: 0 days   Time spent: 35 minutes   Escher Harr Loann Quill, MD [facility]  If 7PM-7AM, please contact night-coverage [email or web address] 08/31/2020, 9:40 AM

## 2020-09-01 DIAGNOSIS — L509 Urticaria, unspecified: Secondary | ICD-10-CM | POA: Diagnosis not present

## 2020-09-01 LAB — CBC
HCT: 32.5 % — ABNORMAL LOW (ref 36.0–46.0)
Hemoglobin: 11.1 g/dL — ABNORMAL LOW (ref 12.0–15.0)
MCH: 31.1 pg (ref 26.0–34.0)
MCHC: 34.2 g/dL (ref 30.0–36.0)
MCV: 91 fL (ref 80.0–100.0)
Platelets: 208 10*3/uL (ref 150–400)
RBC: 3.57 MIL/uL — ABNORMAL LOW (ref 3.87–5.11)
RDW: 11.9 % (ref 11.5–15.5)
WBC: 13.3 10*3/uL — ABNORMAL HIGH (ref 4.0–10.5)
nRBC: 0 % (ref 0.0–0.2)

## 2020-09-01 MED ORDER — FAMOTIDINE 20 MG PO TABS
20.0000 mg | ORAL_TABLET | Freq: Two times a day (BID) | ORAL | Status: DC
  Administered 2020-09-01 – 2020-09-02 (×2): 20 mg via ORAL
  Filled 2020-09-01 (×2): qty 1

## 2020-09-01 NOTE — Progress Notes (Signed)
PROGRESS NOTE    Kelly Figueroa  QMG:[id number] DOB: [birth year] DOA: 08/29/2020 PCP: Binnie Rail, MD   Brief Narrative:  Patient is 39 year old female with no significant past medical history presents to emergency department about 3 days ago with persistent hives.  Patient symptoms started on August 25 2020 with hives in the periphery which started moving towards the trunk and involve the whole body eventually.  No difficulty breathing or tongue swelling or lip swelling or any mucosal ulceration.  Patient had itching all over the hives.  No pain over the hives.  No scarring after the hives resolved.  Denies taking any new medications or perfume or detergents.  Patient symptoms started when she was in [place] and when she came to [place] she came to the ER.  On November 22 patient was given epinephrine initially because of possible anaphylactic reaction by the EMS and in the ER was observed with steroids and H2 blockers and discharged home to follow-up with primary care physician.  Since then her hives have been coming in different places and had gone to her primary care physician yesterday was given steroid shot and was prescribed Pepcid along with hydroxyzine and prednisone 40 mg a tapering dose.  Since hives is getting worse and also had some subjective feeling of fever chills patient decided to come to the ER.  In ED: Patient was noticed to have multiple hives particularly on the trunk and upper back.  No wheezing or stridor.  Patient was given epinephrine because her blood pressure was low by EMS.  In the ER lab work shows blood glucose of 182, potassium of 3.2, no leukocytosis, UA negative, chest x-ray nothing acute except for round density which may be artifact from EKG leads.  Patient had fever of 100.7.  Patient started on steroid, Pepcid and Benadryl and admitted for further management.  Assessment & Plan:   Acute urticaria with possible anaphylaxis: Improving. -Patient was  given epinephrine shot by the EMS on the way to the ER. -Upon admission: Patient had fever of 100.7, no leukocytosis, lactic acid: WNL.  Reviewed chest x-ray. -Sed rate: WNL, LFTs: WNL, UA negative.  COVID-19 negative. -Her symptoms improved with IV steroids, Pepcid, Benadryl as needed, Atarax as needed and calamine lotion.   -She has follow-up appointment with allergy specialist in January 1 week. -Continue monitor his vitals, oxygen saturation. -Tolerated soft diet very well.   Advance diet as tolerated.  Hypomagnesemia: Replenished.  Repeat magnesium level: WNL  Hypokalemia: Replenished.  Repeat potassium level: WNL  Hyperglycemia: Likely in the setting of steroid use.  Continue to monitor.  Leukocytosis: Likely in the setting of IV steroids.  Patient remained afebrile.  No signs of active bleeding.  No indication of antibiotics at this time.  Patient symptoms has improved.  Likely DC tomorrow.  DVT prophylaxis: SCD  code Status: Full code Family Communication:  None present at bedside.  Plan of care discussed with patient in length and she verbalized understanding and agreed with it. Disposition Plan: Home tomorrow consultants:   None  Procedures:   None  Antimicrobials:   None  Status is: Inpatient Dispo: The patient is from: Home              Anticipated d/c is to: Home              Anticipated d/c date is: 1day              Patient currently is not medically stable to  d/c.    Subjective: Patient seen and examined.  Resting comfortably on the bed.  Smiling and tells me that her rash, itching and swelling has improved with IV steroids and Atarax and calamine lotion.  She wants to stay 1 more night to make sure that her symptoms resolve completely.  Objective: Vitals:   08/31/20 1939 08/31/20 2319 09/01/20 0340 09/01/20 0835  BP: 117/76 105/61 111/64 (!) 97/57  Pulse:  72 69 (!) 56  Resp: 20 18 16 17   Temp: 98.7 F (37.1 C) 98.2 F (36.8 C) 98 F (36.7 C) 98.2  F (36.8 C)  TempSrc: Oral Oral Oral Oral  SpO2:  94% 94% 95%  Weight:      Height:        Intake/Output Summary (Last 24 hours) at 09/01/2020 0944 Last data filed at 09/01/2020 0400 Gross per 24 hour  Intake 150 ml  Output 700 ml  Net -550 ml   Filed Weights   08/30/20 0531  Weight: 63.4 kg    Examination:  General exam: Appears calm and comfortable, on room air, communicating well, not appears to be in distress. Respiratory system: Clear to auscultation. Respiratory effort normal. Cardiovascular system: S1 & S2 heard, RRR. No JVD, murmurs, rubs, gallops or clicks. No pedal edema. Gastrointestinal system: Abdomen is nondistended, soft and nontender. No organomegaly or masses felt. Normal bowel sounds heard. Central nervous system: Alert and oriented. No focal neurological deficits. Extremities: Symmetric 5 x 5 power. Skin: Calamine lotion on knees, hands.  No lip swelling noted. Psychiatry: Judgement and insight appear normal. Mood & affect appropriate.    Data Reviewed: I have personally reviewed following labs and imaging studies  CBC: Recent Labs  Lab 08/27/20 1135 08/27/20 1135 08/29/20 2346 08/30/20 0019 08/30/20 0549 08/31/20 0117 09/01/20 0143  WBC 18.7*  --  8.0  --  6.1 8.4 13.3*  NEUTROABS  --   --  7.1  --  4.5  --   --   HGB 15.3*   < > 13.6 13.3 13.7 12.1 11.1*  HCT 45.4   < > 41.0 39.0 40.0 35.2* 32.5*  MCV 92.7  --  92.3  --  89.5 90.7 91.0  PLT 276  --  207  --  213 189 208   < > = values in this interval not displayed.   Basic Metabolic Panel: Recent Labs  Lab 08/27/20 1135 08/29/20 2354 08/30/20 0019 08/30/20 0549 08/30/20 0722 08/31/20 0117  NA 139 137 137 138  --  137  K 3.4* 3.2* 3.1* 3.4*  --  4.2  CL 104 105 101 105  --  109  CO2 22 25  --  22  --  22  GLUCOSE 129* 182* 176* 121*  --  150*  BUN 13 10 10 7   --  5*  CREATININE 0.87 0.88 0.70 0.85  --  0.56  CALCIUM 9.1 7.9*  --  8.0*  --  8.3*  MG  --   --   --   --  1.6* 2.1     GFR: Estimated Creatinine Clearance: 83 mL/min (by C-G formula based on SCr of 0.56 mg/dL). Liver Function Tests: Recent Labs  Lab 08/29/20 2354 08/30/20 0549  AST 23 26  ALT 14 18  ALKPHOS 40 39  BILITOT 0.6 0.9  PROT 5.2* 5.4*  ALBUMIN 3.0* 3.0*   No results for input(s): LIPASE, AMYLASE in the last 168 hours. No results for input(s): AMMONIA in the last 168 hours. Coagulation  Profile: No results for input(s): INR, PROTIME in the last 168 hours. Cardiac Enzymes: No results for input(s): CKTOTAL, CKMB, CKMBINDEX, TROPONINI in the last 168 hours. BNP (last 3 results) No results for input(s): PROBNP in the last 8760 hours. HbA1C: No results for input(s): HGBA1C in the last 72 hours. CBG: No results for input(s): GLUCAP in the last 168 hours. Lipid Profile: No results for input(s): CHOL, HDL, LDLCALC, TRIG, CHOLHDL, LDLDIRECT in the last 72 hours. Thyroid Function Tests: No results for input(s): TSH, T4TOTAL, FREET4, T3FREE, THYROIDAB in the last 72 hours. Anemia Panel: No results for input(s): VITAMINB12, FOLATE, FERRITIN, TIBC, IRON, RETICCTPCT in the last 72 hours. Sepsis Labs: Recent Labs  Lab 08/30/20 0549 08/30/20 0817  LATICACIDVEN 1.0 0.8    Recent Results (from the past 240 hour(s))  Resp Panel by RT-PCR (Flu A&B, Covid) Nasopharyngeal Swab     Status: None   Collection Time: 08/30/20 12:00 AM   Specimen: Nasopharyngeal Swab; Nasopharyngeal(NP) swabs in vial transport medium  Result Value Ref Range Status   SARS Coronavirus 2 by RT PCR NEGATIVE NEGATIVE Final    Comment: (NOTE) SARS-CoV-2 target nucleic acids are NOT DETECTED.  The SARS-CoV-2 RNA is generally detectable in upper respiratory specimens during the acute phase of infection. The lowest concentration of SARS-CoV-2 viral copies this assay can detect is 138 copies/mL. A negative result does not preclude SARS-Cov-2 infection and should not be used as the sole basis for treatment or other  patient management decisions. A negative result may occur with  improper specimen collection/handling, submission of specimen other than nasopharyngeal swab, presence of viral mutation(s) within the areas targeted by this assay, and inadequate number of viral copies(<138 copies/mL). A negative result must be combined with clinical observations, patient history, and epidemiological information. The expected result is Negative.  Fact Sheet for Patients:  EntrepreneurPulse.com.au  Fact Sheet for Healthcare Providers:  IncredibleEmployment.be  This test is no t yet approved or cleared by the [place] FDA and  has been authorized for detection and/or diagnosis of SARS-CoV-2 by FDA under an Emergency Use Authorization (EUA). This EUA will remain  in effect (meaning this test can be used) for the duration of the COVID-19 declaration under Section 564(b)(1) of the Act, 21 U.S.C.section 360bbb-3(b)(1), unless the authorization is terminated  or revoked sooner.       Influenza A by PCR NEGATIVE NEGATIVE Final   Influenza B by PCR NEGATIVE NEGATIVE Final    Comment: (NOTE) The Xpert Xpress SARS-CoV-2/FLU/RSV plus assay is intended as an aid in the diagnosis of influenza from Nasopharyngeal swab specimens and should not be used as a sole basis for treatment. Nasal washings and aspirates are unacceptable for Xpert Xpress SARS-CoV-2/FLU/RSV testing.  Fact Sheet for Patients: EntrepreneurPulse.com.au  Fact Sheet for Healthcare Providers: IncredibleEmployment.be  This test is not yet approved or cleared by the [place] FDA and has been authorized for detection and/or diagnosis of SARS-CoV-2 by FDA under an Emergency Use Authorization (EUA). This EUA will remain in effect (meaning this test can be used) for the duration of the COVID-19 declaration under Section 564(b)(1) of the Act, 21 U.S.C. section  360bbb-3(b)(1), unless the authorization is terminated or revoked.  Performed at [facility], [place] [address], [place], [place] [address]   Culture, blood (routine x 2)     Status: None (Preliminary result)   Collection Time: 08/30/20  5:49 AM   Specimen: BLOOD  Result Value Ref Range Status   Specimen Description  BLOOD LEFT ANTECUBITAL  Final   Special Requests   Final    BOTTLES DRAWN AEROBIC AND ANAEROBIC Blood Culture results may not be optimal due to an excessive volume of blood received in culture bottles   Culture   Final    NO GROWTH 2 DAYS Performed at [facility], [place] [address] [place], [place] [address]    Report Status PENDING  Incomplete  Culture, blood (routine x 2)     Status: None (Preliminary result)   Collection Time: 08/30/20  5:52 AM   Specimen: BLOOD LEFT HAND  Result Value Ref Range Status   Specimen Description BLOOD LEFT HAND  Final   Special Requests   Final    BOTTLES DRAWN AEROBIC AND ANAEROBIC Blood Culture adequate volume   Culture   Final    NO GROWTH 2 DAYS Performed at [facility], [place] [address], [place], [place] [address]    Report Status PENDING  Incomplete      Radiology Studies: No results found.  Scheduled Meds: . calamine   Topical TID  . loratadine  10 mg Oral Daily  . methylPREDNISolone (SOLU-MEDROL) injection  60 mg Intravenous Q6H  . potassium chloride  40 mEq Oral Once   Continuous Infusions: . famotidine (PEPCID) IV 20 mg (09/01/20 0908)     LOS: 1 day   Time spent: 35 minutes   Ivie Maese Loann Quill, MD [facility]  If 7PM-7AM, please contact night-coverage [email or web address] 09/01/2020, 9:44 AM

## 2020-09-02 DIAGNOSIS — L509 Urticaria, unspecified: Secondary | ICD-10-CM | POA: Diagnosis not present

## 2020-09-02 LAB — BASIC METABOLIC PANEL
Anion gap: 11 (ref 5–15)
BUN: 16 mg/dL (ref 6–20)
CO2: 23 mmol/L (ref 22–32)
Calcium: 8.7 mg/dL — ABNORMAL LOW (ref 8.9–10.3)
Chloride: 105 mmol/L (ref 98–111)
Creatinine, Ser: 0.8 mg/dL (ref 0.44–1.00)
GFR, Estimated: >60 mL/min (ref >60)
Glucose, Bld: 153 mg/dL — ABNORMAL HIGH (ref 70–99)
Potassium: 3.8 mmol/L (ref 3.5–5.1)
Sodium: 139 mmol/L (ref 135–145)

## 2020-09-02 LAB — CBC
HCT: 33.9 % — ABNORMAL LOW (ref 36.0–46.0)
Hemoglobin: 11.7 g/dL — ABNORMAL LOW (ref 12.0–15.0)
MCH: 31.2 pg (ref 26.0–34.0)
MCHC: 34.5 g/dL (ref 30.0–36.0)
MCV: 90.4 fL (ref 80.0–100.0)
Platelets: 242 10*3/uL (ref 150–400)
RBC: 3.75 MIL/uL — ABNORMAL LOW (ref 3.87–5.11)
RDW: 11.9 % (ref 11.5–15.5)
WBC: 15.9 10*3/uL — ABNORMAL HIGH (ref 4.0–10.5)
nRBC: 0 % (ref 0.0–0.2)

## 2020-09-02 MED ORDER — HYDROXYZINE HCL 10 MG PO TABS
10.0000 mg | ORAL_TABLET | Freq: Four times a day (QID) | ORAL | 0 refills | Status: DC | PRN
Start: 2020-09-02 — End: 2020-12-13

## 2020-09-02 MED ORDER — PREDNISONE 20 MG PO TABS: 40.0000 mg | ORAL_TABLET | Freq: Every day | ORAL | 0 refills | Status: AC

## 2020-09-02 MED ORDER — CALAMINE EX LOTN
TOPICAL_LOTION | Freq: Three times a day (TID) | CUTANEOUS | 0 refills | Status: DC
Start: 2020-09-02 — End: 2020-12-13

## 2020-09-02 MED ORDER — FAMOTIDINE 20 MG PO TABS
20.0000 mg | ORAL_TABLET | Freq: Every day | ORAL | 0 refills | Status: DC
Start: 2020-09-02 — End: 2020-12-13

## 2020-09-02 NOTE — Discharge Summary (Signed)
Physician Discharge Summary  Kelly Figueroa BTD:[id number] DOB: 25-Aug-1981 DOA: 08/29/2020  PCP: Binnie Rail, MD  Admit date: 08/29/2020 Discharge date: 09/02/2020  Admitted From: Home Disposition: Home  Recommendations for Outpatient Follow-up:  1. Follow-up with PCP in 1 week 2. Repeat CBC on follow-up visit 3. Follow-up with allergy immunology in first week of January as a scheduled. 4. Take Atarax or Benadryl as needed for itching 5. Continue prednisone 40 mg daily for 5 more days.  Home Health: None Equipment/Devices: None Discharge Condition: Stable CODE STATUS: Full code Diet recommendation: Regular diet  Brief/Interim Summary: Patient is 39 year old female with no significant past medical history presents to emergency department about 3 days ago with persistent hives. Patient symptoms started on November 202021 with hives in the periphery which started moving towards the trunk and involve the whole body eventually. No difficulty breathing or tongue swelling or lip swelling or any mucosal ulceration. Patient had itching all over the hives. No pain over the hives. No scarring after the hives resolved. Denies taking any new medications or perfume or detergents. Patient symptoms started when she was in [place] and when she came to [place] she came to the ER. On November 22 patient was given epinephrine initially because of possible anaphylactic reaction by the EMS and in the ER was observed with steroids and H2 blockers and discharged home to follow-up with primary care physician. Since then her hives have been coming in different places and had gone to her primary care physician yesterday was given steroid shot and was prescribed Pepcid along with hydroxyzine and prednisone 40 mg a tapering dose. Since hives is getting worse and also had some subjective feeling of fever chills patient decided to come to the ER.  In ED: Patient was noticed to have multiple hives  particularly on the trunk and upper back.  No wheezing or stridor.  Patient was given epinephrine because her blood pressure was low by EMS.  In the ER lab work shows blood glucose of 182, potassium of 3.2, no leukocytosis, UA negative, chest x-ray nothing acute except for round density which may be artifact from EKG leads.  Patient had fever of 100.7.  Patient started on steroid, Pepcid and Benadryl and admitted for further management.  Acute urticaria with possible anaphylaxis:  -Upon admission: Patient had fever of 100.7, no leukocytosis, lactic acid: WNL.  X-ray negative for acute findings.. -Sed rate: WNL, LFTs: WNL, UA negative.  COVID-19 negative. -Her symptoms improved with IV steroids, Pepcid, Benadryl as needed, Atarax as needed and calamine lotion.   -She has follow-up appointment with allergy specialist in January 1 week. -Tolerated diet without any issues. -Will be discharged on prednisone 40 mg for 5  days, Benadryl and Atarax as needed for itching and calamine lotion.  Hypomagnesemia: Replenished.  Repeat magnesium level: WNL  Hypokalemia: Replenished.  Repeat potassium level: WNL  Hyperglycemia: Likely in the setting of steroid use.    Leukocytosis: Likely in the setting of IV steroids.  Patient remained afebrile.  No signs of active infection.  No indication of antibiotics at this time.  Discharge Diagnoses:  Acute urticaria with possible anaphylaxis Hypomagnesemia Hypokalemia Hyperglycemia Leukocytosis  Discharge Instructions  Discharge Instructions    Diet general   Complete by: As directed    Discharge instructions   Complete by: As directed    Follow-up with PCP in 1 week Repeat CBC on follow-up visit Follow-up with allergy immunology as a scheduled on first week of January.  Increase activity slowly   Complete by: As directed      Allergies as of 09/02/2020      Reactions   Milk Protein Other (See Comments)   Stomach cramps      Medication List     TAKE these medications   calamine lotion Apply topically 3 (three) times daily.   diphenhydrAMINE 25 MG tablet Commonly known as: BENADRYL Take 50 mg by mouth 2 (two) times daily as needed (allergic reaction).   EPINEPHrine 0.3 mg/0.3 mL Soaj injection Commonly known as: EPI-PEN Inject 0.3 mg into the muscle as needed for anaphylaxis.   famotidine 20 MG tablet Commonly known as: PEPCID Take 1 tablet (20 mg total) by mouth daily. What changed:   medication strength  how much to take   hydrOXYzine 10 MG tablet Commonly known as: ATARAX/VISTARIL Take 1 tablet (10 mg total) by mouth every 6 (six) hours as needed for itching. What changed:   medication strength  how much to take  when to take this  reasons to take this   predniSONE 20 MG tablet Commonly known as: Deltasone Take 2 tablets (40 mg total) by mouth daily for 5 days. What changed:   medication strength  how much to take  how to take this  when to take this  additional instructions   Probiotic Blend Caps Take 1 capsule by mouth daily.   Vitamin D 50 MCG (2000 UT) tablet Take 1 tablet (2,000 Units total) by mouth daily.       Allergies  Allergen Reactions  . Milk Protein Other (See Comments)    Stomach cramps    Consultations:  None   Procedures/Studies: DG Chest Port 1 View  Result Date: 08/30/2020 CLINICAL DATA:  Shortness of breath EXAM: PORTABLE CHEST 1 VIEW COMPARISON:  05/29/2016 FINDINGS: There is no acute cardiopulmonary process. The heart size is unremarkable. There is no pneumothorax or large pleural effusion. No focal infiltrate. No acute osseous abnormality. There is a rounded density immediately inferior to the left EKG lead. IMPRESSION: 1. No definite acute cardiopulmonary process. 2. Rounded density inferior to the left EKG lead. This may be artifact from the EKG lead itself or represent a small pulmonary nodule. A repeat two-view chest x-ray is recommended with removal  of the EKG lead. Electronically Signed   By: Constance Holster M.D.   On: 08/30/2020 00:11      Subjective:  Patient seen and examined.  Sitting comfortably on the bed.  Husband at bedside.  Tells me that she is doing much better and her rash has resolved.  Wishes to go home.  Denies fever, chills, nausea, vomiting, abdominal pain.  Has tolerated regular diet without any issues.  Discharge Exam: Vitals:   09/02/20 0418 09/02/20 0928  BP: (!) 101/52 114/69  Pulse: 99 60  Resp: 20 18  Temp: 98.3 F (36.8 C) 98 F (36.7 C)  SpO2: 94% 98%   Vitals:   09/01/20 1926 09/01/20 2320 09/02/20 0418 09/02/20 0928  BP: 112/75 (!) 95/54 (!) 101/52 114/69  Pulse: 72 62 99 60  Resp: 18 19 20 18   Temp: 98.4 F (36.9 C) 98.5 F (36.9 C) 98.3 F (36.8 C) 98 F (36.7 C)  TempSrc: Oral Oral Oral Oral  SpO2: 96% 96% 94% 98%  Weight:      Height:        General: Pt is alert, awake, not in acute distress Cardiovascular: RRR, S1/S2 +, no rubs, no gallops Respiratory: CTA bilaterally, no  wheezing, no rhonchi Abdominal: Soft, NT, ND, bowel sounds + Extremities: no edema, no cyanosis    The results of significant diagnostics from this hospitalization (including imaging, microbiology, ancillary and laboratory) are listed below for reference.     Microbiology: Recent Results (from the past 240 hour(s))  Resp Panel by RT-PCR (Flu A&B, Covid) Nasopharyngeal Swab     Status: None   Collection Time: 08/30/20 12:00 AM   Specimen: Nasopharyngeal Swab; Nasopharyngeal(NP) swabs in vial transport medium  Result Value Ref Range Status   SARS Coronavirus 2 by RT PCR NEGATIVE NEGATIVE Final    Comment: (NOTE) SARS-CoV-2 target nucleic acids are NOT DETECTED.  The SARS-CoV-2 RNA is generally detectable in upper respiratory specimens during the acute phase of infection. The lowest concentration of SARS-CoV-2 viral copies this assay can detect is 138 copies/mL. A negative result does not preclude  SARS-Cov-2 infection and should not be used as the sole basis for treatment or other patient management decisions. A negative result may occur with  improper specimen collection/handling, submission of specimen other than nasopharyngeal swab, presence of viral mutation(s) within the areas targeted by this assay, and inadequate number of viral copies(<138 copies/mL). A negative result must be combined with clinical observations, patient history, and epidemiological information. The expected result is Negative.  Fact Sheet for Patients:  EntrepreneurPulse.com.au  Fact Sheet for Healthcare Providers:  IncredibleEmployment.be  This test is no t yet approved or cleared by the [place] FDA and  has been authorized for detection and/or diagnosis of SARS-CoV-2 by FDA under an Emergency Use Authorization (EUA). This EUA will remain  in effect (meaning this test can be used) for the duration of the COVID-19 declaration under Section 564(b)(1) of the Act, 21 U.S.C.section 360bbb-3(b)(1), unless the authorization is terminated  or revoked sooner.       Influenza A by PCR NEGATIVE NEGATIVE Final   Influenza B by PCR NEGATIVE NEGATIVE Final    Comment: (NOTE) The Xpert Xpress SARS-CoV-2/FLU/RSV plus assay is intended as an aid in the diagnosis of influenza from Nasopharyngeal swab specimens and should not be used as a sole basis for treatment. Nasal washings and aspirates are unacceptable for Xpert Xpress SARS-CoV-2/FLU/RSV testing.  Fact Sheet for Patients: EntrepreneurPulse.com.au  Fact Sheet for Healthcare Providers: IncredibleEmployment.be  This test is not yet approved or cleared by the [place] FDA and has been authorized for detection and/or diagnosis of SARS-CoV-2 by FDA under an Emergency Use Authorization (EUA). This EUA will remain in effect (meaning this test can be used) for the duration of  the COVID-19 declaration under Section 564(b)(1) of the Act, 21 U.S.C. section 360bbb-3(b)(1), unless the authorization is terminated or revoked.  Performed at [facility], [place] [address] [place], [place] [address]   Culture, blood (routine x 2)     Status: None (Preliminary result)   Collection Time: 08/30/20  5:49 AM   Specimen: BLOOD  Result Value Ref Range Status   Specimen Description BLOOD LEFT ANTECUBITAL  Final   Special Requests   Final    BOTTLES DRAWN AEROBIC AND ANAEROBIC Blood Culture results may not be optimal due to an excessive volume of blood received in culture bottles   Culture   Final    NO GROWTH 3 DAYS Performed at [facility], [place] [address] [place], [place] [address]    Report Status PENDING  Incomplete  Culture, blood (routine x 2)     Status: None (Preliminary result)   Collection  Time: 08/30/20  5:52 AM   Specimen: BLOOD LEFT HAND  Result Value Ref Range Status   Specimen Description BLOOD LEFT HAND  Final   Special Requests   Final    BOTTLES DRAWN AEROBIC AND ANAEROBIC Blood Culture adequate volume   Culture   Final    NO GROWTH 3 DAYS Performed at [facility], [address] [place], [place] [address]    Report Status PENDING  Incomplete     Labs: BNP (last 3 results) No results for input(s): BNP in the last 8760 hours. Basic Metabolic Panel: Recent Labs  Lab 08/27/20 1135 08/27/20 1135 08/29/20 2354 08/30/20 0019 08/30/20 0549 08/30/20 0722 08/31/20 0117 09/02/20 0129  NA 139   < > 137 137 138  --  137 139  K 3.4*   < > 3.2* 3.1* 3.4*  --  4.2 3.8  CL 104   < > 105 101 105  --  109 105  CO2 22  --  25  --  22  --  22 23  GLUCOSE 129*   < > 182* 176* 121*  --  150* 153*  BUN 13   < > 10 10 7   --  5* 16  CREATININE 0.87   < > 0.88 0.70 0.85  --  0.56 0.80  CALCIUM 9.1  --  7.9*  --  8.0*  --  8.3* 8.7*  MG  --   --   --   --   --  1.6* 2.1  --    < > = values in this interval not displayed.   Liver  Function Tests: Recent Labs  Lab 08/29/20 2354 08/30/20 0549  AST 23 26  ALT 14 18  ALKPHOS 40 39  BILITOT 0.6 0.9  PROT 5.2* 5.4*  ALBUMIN 3.0* 3.0*   No results for input(s): LIPASE, AMYLASE in the last 168 hours. No results for input(s): AMMONIA in the last 168 hours. CBC: Recent Labs  Lab 08/29/20 2346 08/29/20 2346 08/30/20 0019 08/30/20 0549 08/31/20 0117 09/01/20 0143 09/02/20 0129  WBC 8.0  --   --  6.1 8.4 13.3* 15.9*  NEUTROABS 7.1  --   --  4.5  --   --   --   HGB 13.6   < > 13.3 13.7 12.1 11.1* 11.7*  HCT 41.0   < > 39.0 40.0 35.2* 32.5* 33.9*  MCV 92.3  --   --  89.5 90.7 91.0 90.4  PLT 207  --   --  213 189 208 242   < > = values in this interval not displayed.   Cardiac Enzymes: No results for input(s): CKTOTAL, CKMB, CKMBINDEX, TROPONINI in the last 168 hours. BNP: Invalid input(s): POCBNP CBG: No results for input(s): GLUCAP in the last 168 hours. D-Dimer No results for input(s): DDIMER in the last 72 hours. Hgb A1c No results for input(s): HGBA1C in the last 72 hours. Lipid Profile No results for input(s): CHOL, HDL, LDLCALC, TRIG, CHOLHDL, LDLDIRECT in the last 72 hours. Thyroid function studies No results for input(s): TSH, T4TOTAL, T3FREE, THYROIDAB in the last 72 hours.  Invalid input(s): FREET3 Anemia work up No results for input(s): VITAMINB12, FOLATE, FERRITIN, TIBC, IRON, RETICCTPCT in the last 72 hours. Urinalysis    Component Value Date/Time   COLORURINE YELLOW 08/30/2020 0102   APPEARANCEUR CLEAR 08/30/2020 0102   LABSPEC 1.010 08/30/2020 0102   PHURINE 6.0 08/30/2020 0102   GLUCOSEU NEGATIVE 08/30/2020 0102   HGBUR NEGATIVE  08/30/2020 0102   BILIRUBINUR NEGATIVE 08/30/2020 0102   KETONESUR NEGATIVE 08/30/2020 0102   PROTEINUR NEGATIVE 08/30/2020 0102   NITRITE NEGATIVE 08/30/2020 0102   LEUKOCYTESUR NEGATIVE 08/30/2020 0102   Sepsis Labs Invalid input(s): PROCALCITONIN,  WBC,  LACTICIDVEN Microbiology Recent Results  (from the past 240 hour(s))  Resp Panel by RT-PCR (Flu A&B, Covid) Nasopharyngeal Swab     Status: None   Collection Time: 08/30/20 12:00 AM   Specimen: Nasopharyngeal Swab; Nasopharyngeal(NP) swabs in vial transport medium  Result Value Ref Range Status   SARS Coronavirus 2 by RT PCR NEGATIVE NEGATIVE Final    Comment: (NOTE) SARS-CoV-2 target nucleic acids are NOT DETECTED.  The SARS-CoV-2 RNA is generally detectable in upper respiratory specimens during the acute phase of infection. The lowest concentration of SARS-CoV-2 viral copies this assay can detect is 138 copies/mL. A negative result does not preclude SARS-Cov-2 infection and should not be used as the sole basis for treatment or other patient management decisions. A negative result may occur with  improper specimen collection/handling, submission of specimen other than nasopharyngeal swab, presence of viral mutation(s) within the areas targeted by this assay, and inadequate number of viral copies(<138 copies/mL). A negative result must be combined with clinical observations, patient history, and epidemiological information. The expected result is Negative.  Fact Sheet for Patients:  EntrepreneurPulse.com.au  Fact Sheet for Healthcare Providers:  IncredibleEmployment.be  This test is no t yet approved or cleared by the [place] FDA and  has been authorized for detection and/or diagnosis of SARS-CoV-2 by FDA under an Emergency Use Authorization (EUA). This EUA will remain  in effect (meaning this test can be used) for the duration of the COVID-19 declaration under Section 564(b)(1) of the Act, 21 U.S.C.section 360bbb-3(b)(1), unless the authorization is terminated  or revoked sooner.       Influenza A by PCR NEGATIVE NEGATIVE Final   Influenza B by PCR NEGATIVE NEGATIVE Final    Comment: (NOTE) The Xpert Xpress SARS-CoV-2/FLU/RSV plus assay is intended as an aid in the  diagnosis of influenza from Nasopharyngeal swab specimens and should not be used as a sole basis for treatment. Nasal washings and aspirates are unacceptable for Xpert Xpress SARS-CoV-2/FLU/RSV testing.  Fact Sheet for Patients: EntrepreneurPulse.com.au  Fact Sheet for Healthcare Providers: IncredibleEmployment.be  This test is not yet approved or cleared by the [place] FDA and has been authorized for detection and/or diagnosis of SARS-CoV-2 by FDA under an Emergency Use Authorization (EUA). This EUA will remain in effect (meaning this test can be used) for the duration of the COVID-19 declaration under Section 564(b)(1) of the Act, 21 U.S.C. section 360bbb-3(b)(1), unless the authorization is terminated or revoked.  Performed at [facility], [place] [address] [place], [place] [address]   Culture, blood (routine x 2)     Status: None (Preliminary result)   Collection Time: 08/30/20  5:49 AM   Specimen: BLOOD  Result Value Ref Range Status   Specimen Description BLOOD LEFT ANTECUBITAL  Final   Special Requests   Final    BOTTLES DRAWN AEROBIC AND ANAEROBIC Blood Culture results may not be optimal due to an excessive volume of blood received in culture bottles   Culture   Final    NO GROWTH 3 DAYS Performed at [facility], [place] [address], [place], [place] [address]    Report Status PENDING  Incomplete  Culture, blood (routine x 2)     Status: None (Preliminary result)  Collection Time: 08/30/20  5:52 AM   Specimen: BLOOD LEFT HAND  Result Value Ref Range Status   Specimen Description BLOOD LEFT HAND  Final   Special Requests   Final    BOTTLES DRAWN AEROBIC AND ANAEROBIC Blood Culture adequate volume   Culture   Final    NO GROWTH 3 DAYS Performed at [facility], [address] [place], [place] [address]    Report Status PENDING  Incomplete     Time coordinating discharge: Over 30  minutes  SIGNED:   Mckinley Jewel, MD  [facility] 09/02/2020, 10:05 AM Pager   If 7PM-7AM, please contact night-coverage [email or web address]

## 2020-09-04 LAB — CULTURE, BLOOD (ROUTINE X 2)
Culture: NO GROWTH
Culture: NO GROWTH
Special Requests: ADEQUATE

## 2020-10-02 ENCOUNTER — Telehealth: Payer: Self-pay | Admitting: Internal Medicine

## 2020-10-02 NOTE — Telephone Encounter (Signed)
I have received FMLA to complete for patient's health conditions due for husband being out of work with patient. Start 11/22 to 11/28.  Forms have been completed &Placed in provider box to review and sign.  Provider out of office until 10/08/20.

## 2020-10-04 DIAGNOSIS — Z0279 Encounter for issue of other medical certificate: Secondary | ICD-10-CM

## 2020-10-08 NOTE — Telephone Encounter (Signed)
Forms have been completed, Faxed, Sent to scan &Charged for.   LVM to inform patient. Original mailed to patient for her records.

## 2020-10-12 ENCOUNTER — Other Ambulatory Visit: Payer: Self-pay
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
## 2020-10-15 ENCOUNTER — Ambulatory Visit (INDEPENDENT_AMBULATORY_CARE_PROVIDER_SITE_OTHER): Payer: 59 | Admitting: Internal Medicine

## 2020-10-15 ENCOUNTER — Other Ambulatory Visit: Payer: Self-pay

## 2020-10-15 ENCOUNTER — Encounter: Payer: Self-pay | Admitting: Internal Medicine

## 2020-10-15 VITALS — BP 108/76 | HR 91 | Temp 98.0°F | Ht 63.0 in | Wt 140.0 lb

## 2020-10-15 DIAGNOSIS — L508 Other urticaria: Secondary | ICD-10-CM

## 2020-10-15 DIAGNOSIS — R739 Hyperglycemia, unspecified: Secondary | ICD-10-CM | POA: Diagnosis not present

## 2020-10-15 LAB — COMPREHENSIVE METABOLIC PANEL
ALT: 11 U/L (ref 0–35)
AST: 15 U/L (ref 0–37)
Albumin: 4.5 g/dL (ref 3.5–5.2)
Alkaline Phosphatase: 63 U/L (ref 39–117)
BUN: 11 mg/dL (ref 6–23)
CO2: 29 mEq/L (ref 19–32)
Calcium: 8.9 mg/dL (ref 8.4–10.5)
Chloride: 107 mEq/L (ref 96–112)
Creatinine, Ser: 0.7 mg/dL (ref 0.40–1.20)
GFR: 107.26 mL/min (ref >60.00)
Glucose, Bld: 80 mg/dL (ref 70–99)
Potassium: 4.1 mEq/L (ref 3.5–5.1)
Sodium: 139 mEq/L (ref 135–145)
Total Bilirubin: 0.4 mg/dL (ref 0.2–1.2)
Total Protein: 6.9 g/dL (ref 6.0–8.3)

## 2020-10-15 LAB — CBC WITH DIFFERENTIAL/PLATELET
Basophils Absolute: 0 10*3/uL (ref 0.0–0.1)
Basophils Relative: 0.5 % (ref 0.0–3.0)
Eosinophils Absolute: 0.3 10*3/uL (ref 0.0–0.7)
Eosinophils Relative: 5.5 % — ABNORMAL HIGH (ref 0.0–5.0)
HCT: 41.5 % (ref 36.0–46.0)
Hemoglobin: 14 g/dL (ref 12.0–15.0)
Lymphocytes Relative: 27.7 % (ref 12.0–46.0)
Lymphs Abs: 1.4 10*3/uL (ref 0.7–4.0)
MCHC: 33.7 g/dL (ref 30.0–36.0)
MCV: 92 fl (ref 78.0–100.0)
Monocytes Absolute: 0.4 10*3/uL (ref 0.1–1.0)
Monocytes Relative: 7.4 % (ref 3.0–12.0)
Neutro Abs: 2.9 10*3/uL (ref 1.4–7.7)
Neutrophils Relative %: 58.9 % (ref 43.0–77.0)
Platelets: 180 10*3/uL (ref 150.0–400.0)
RBC: 4.51 Mil/uL (ref 3.87–5.11)
RDW: 12.9 % (ref 11.5–15.5)
WBC: 5 10*3/uL (ref 4.0–10.5)

## 2020-10-15 LAB — HEMOGLOBIN A1C: Hgb A1c MFr Bld: 5.7 % (ref 4.6–6.5)

## 2020-10-15 NOTE — Assessment & Plan Note (Signed)
Has had recurrent urticaria Has been in the emergency room twice Still having urticaria that comes and goes throughout her body No obvious cause Currently taking Benadryl as needed, which she will continue She has an appointment with an allergist 1/30 She would like to avoid a daily antihistamine because it did not help when she took Claritin on a daily basis Continue Benadryl as needed Consider keeping a food log Also notes some intermittent bruising that is nontraumatic and joint pain We will check blood work to rule out other causes-further evaluation by allergy Alpha gal panel, ANA, CBC, CMP

## 2020-10-15 NOTE — Assessment & Plan Note (Signed)
Acute Blood work in the emergency room showed hyperglycemia Likely related to being on steroids CMP, A1c

## 2020-10-15 NOTE — Patient Instructions (Signed)
  Blood work was ordered.     Medications changes include :   none     

## 2020-10-18 ENCOUNTER — Encounter: Payer: Self-pay | Admitting: Internal Medicine

## 2020-10-19 ENCOUNTER — Encounter: Payer: Self-pay | Admitting: Internal Medicine

## 2020-10-19 LAB — ALPHA-GAL PANEL
Beef IgE: <0.1 kU/L (ref <0.35)
Class: 0
Class: 0
Class: 0
Galactose-alpha-1,3-galactose IgE: <0.1 kU/L (ref <0.10)
LAMB/MUTTON IGE: <0.1 kU/L (ref <0.35)
Pork IgE: <0.1 kU/L (ref <0.35)

## 2020-10-19 LAB — ANA: Anti Nuclear Antibody (ANA): POSITIVE — AB

## 2020-10-19 LAB — ANTI-NUCLEAR AB-TITER (ANA TITER): ANA Titer 1: 1:40 {titer} — ABNORMAL HIGH
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
## 2020-12-13 ENCOUNTER — Other Ambulatory Visit: Payer: Self-pay

## 2020-12-13 ENCOUNTER — Encounter: Payer: Self-pay | Admitting: Internal Medicine

## 2020-12-13 ENCOUNTER — Ambulatory Visit (INDEPENDENT_AMBULATORY_CARE_PROVIDER_SITE_OTHER): Payer: 59 | Admitting: Internal Medicine

## 2020-12-13 VITALS — BP 118/74 | HR 85 | Temp 98.0°F | Ht 63.0 in | Wt 141.0 lb

## 2020-12-13 DIAGNOSIS — R0602 Shortness of breath: Secondary | ICD-10-CM | POA: Diagnosis not present

## 2020-12-13 DIAGNOSIS — L508 Other urticaria: Secondary | ICD-10-CM | POA: Diagnosis not present

## 2020-12-13 MED ORDER — AEROCHAMBER PLUS MISC: 0 refills | Status: DC

## 2020-12-13 MED ORDER — FAMOTIDINE 40 MG PO TABS: 40.0000 mg | ORAL_TABLET | Freq: Every day | ORAL | 1 refills | Status: DC

## 2020-12-13 MED ORDER — ALBUTEROL SULFATE HFA 108 (90 BASE) MCG/ACT IN AERS: 2.0000 | INHALATION_SPRAY | Freq: Four times a day (QID) | RESPIRATORY_TRACT | 5 refills | Status: DC | PRN

## 2020-12-13 NOTE — Patient Instructions (Signed)
   Medications changes include :  An inhaler, pepcid at bedtime   Your prescription(s) have been submitted to your pharmacy. Please take as directed and contact our office if you believe you are having problem(s) with the medication(s).   A referral was ordered for pulmonary       Someone from their office will call you to schedule an appointment.     Call allergy.

## 2020-12-13 NOTE — Assessment & Plan Note (Signed)
New Occurs with exercise and sometimes waking in the middle of night Cough in the middle of the night ? Gerd, vs asthma Start pepcid 40 mg HS Albuterol 30 minutes prior to exercise and at night prn Referral to pulm

## 2020-12-13 NOTE — Assessment & Plan Note (Signed)
Following with allergy Taking zyrtec daily, benadryl daily Still having rashes, hives, itching Start pepcid 40 mg nightly Call allergy

## 2021-03-06 ENCOUNTER — Encounter: Payer: Self-pay | Admitting: Internal Medicine
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
## 2021-06-27 ENCOUNTER — Ambulatory Visit: Payer: 59 | Admitting: Internal Medicine

## 2021-06-27 ENCOUNTER — Encounter: Payer: Self-pay | Admitting: Internal Medicine

## 2021-06-27 ENCOUNTER — Other Ambulatory Visit: Payer: Self-pay

## 2021-06-27 VITALS — BP 112/80 | HR 68 | Temp 98.1°F | Ht 63.0 in | Wt 143.0 lb

## 2021-06-27 DIAGNOSIS — G43009 Migraine without aura, not intractable, without status migrainosus: Secondary | ICD-10-CM

## 2021-06-27 MED ORDER — ONDANSETRON 4 MG PO TBDP: 4.0000 mg | ORAL_TABLET | Freq: Three times a day (TID) | ORAL | 0 refills | Status: DC | PRN

## 2021-06-27 MED ORDER — ZOLMITRIPTAN 5 MG PO TBDP: 5.0000 mg | ORAL_TABLET | ORAL | 5 refills | Status: DC | PRN

## 2021-06-27 NOTE — Assessment & Plan Note (Signed)
Chronic Migraine headache started 2021 Increased headaches this month-likely related to steroids she recently took Typically has a few migraines a month and would like to avoid daily medication-has not tried anything prescription for migraines Discussed trying to identify triggers Can try supplements, including magnesium on a daily basis Discussed importance of healthy lifestyle Trial of Zomig 5 mg as needed for migraine, can repeat once after 2 hours if needed Zofran 4 mg every 8 hours as needed for nausea Discussed that she can take an NSAID with the triptan  Can consider other medications if still make is not effective.  Also discussed Topamax  Will follow-up in November

## 2021-06-27 NOTE — Patient Instructions (Addendum)
Medications changes include :   zomig for migraines. Zofran for nausea associated with migraines.    Your prescription(s) have been submitted to your pharmacy. Please take as directed and contact our office if you believe you are having problem(s) with the medication(s).   Magnesium, riboflavin (vitamin B2), and coenzyme Q10 may all help reduce the frequency of migraine attacks.    Migraine Headache A migraine headache is an intense, throbbing pain on one side or both sides of the head. Migraine headaches may also cause other symptoms, such as nausea, vomiting, and sensitivity to light and noise. A migraine headache can last from 4 hours to 3 days. Talk with your doctor about what things may bring on (trigger) your migraine headaches. What are the causes? The exact cause of this condition is not known. However, a migraine may be caused when nerves in the brain become irritated and release chemicals that cause inflammation of blood vessels. This inflammation causes pain. This condition may be triggered or caused by: Drinking alcohol. Smoking. Taking medicines, such as: Medicine used to treat chest pain (nitroglycerin). Birth control pills. Estrogen. Certain blood pressure medicines. Eating or drinking products that contain nitrates, glutamate, aspartame, or tyramine. Aged cheeses, chocolate, or caffeine may also be triggers. Doing physical activity. Other things that may trigger a migraine headache include: Menstruation. Pregnancy. Hunger. Stress. Lack of sleep or too much sleep. Weather changes. Fatigue. What increases the risk? The following factors may make you more likely to experience migraine headaches: Being a certain age. This condition is more common in people who are 36-62 years old. Being female. Having a family history of migraine headaches. Being Caucasian. Having a mental health condition, such as depression or anxiety. Being obese. What are the signs or  symptoms? The main symptom of this condition is pulsating or throbbing pain. This pain may: Happen in any area of the head, such as on one side or both sides. Interfere with daily activities. Get worse with physical activity. Get worse with exposure to bright lights or loud noises. Other symptoms may include: Nausea. Vomiting. Dizziness. General sensitivity to bright lights, loud noises, or smells. Before you get a migraine headache, you may get warning signs (an aura). An aura may include: Seeing flashing lights or having blind spots. Seeing bright spots, halos, or zigzag lines. Having tunnel vision or blurred vision. Having numbness or a tingling feeling. Having trouble talking. Having muscle weakness. Some people have symptoms after a migraine headache (postdromal phase), such as: Feeling tired. Difficulty concentrating. How is this diagnosed? A migraine headache can be diagnosed based on: Your symptoms. A physical exam. Tests, such as: CT scan or an MRI of the head. These imaging tests can help rule out other causes of headaches. Taking fluid from the spine (lumbar puncture) and analyzing it (cerebrospinal fluid analysis, or CSF analysis). How is this treated? This condition may be treated with medicines that: Relieve pain. Relieve nausea. Prevent migraine headaches. Treatment for this condition may also include: Acupuncture. Lifestyle changes like avoiding foods that trigger migraine headaches. Biofeedback. Cognitive behavioral therapy. Follow these instructions at home: Medicines Take over-the-counter and prescription medicines only as told by your health care provider. Ask your health care provider if the medicine prescribed to you: Requires you to avoid driving or using heavy machinery. Can cause constipation. You may need to take these actions to prevent or treat constipation: Drink enough fluid to keep your urine pale yellow. Take over-the-counter or  prescription medicines. Eat foods  that are high in fiber, such as beans, whole grains, and fresh fruits and vegetables. Limit foods that are high in fat and processed sugars, such as fried or sweet foods. Lifestyle Do not drink alcohol. Do not use any products that contain nicotine or tobacco, such as cigarettes, e-cigarettes, and chewing tobacco. If you need help quitting, ask your health care provider. Get at least 8 hours of sleep every night. Find ways to manage stress, such as meditation, deep breathing, or yoga. General instructions   Keep a journal to find out what may trigger your migraine headaches. For example, write down: What you eat and drink. How much sleep you get. Any change to your diet or medicines. If you have a migraine headache: Avoid things that make your symptoms worse, such as bright lights. It may help to lie down in a dark, quiet room. Do not drive or use heavy machinery. Ask your health care provider what activities are safe for you while you are experiencing symptoms. Keep all follow-up visits as told by your health care provider. This is important. Contact a health care provider if: You develop symptoms that are different or more severe than your usual migraine headache symptoms. You have more than 15 headache days in one month. Get help right away if: Your migraine headache becomes severe. Your migraine headache lasts longer than 72 hours. You have a fever. You have a stiff neck. You have vision loss. Your muscles feel weak or like you cannot control them. You start to lose your balance often. You have trouble walking. You faint. You have a seizure. Summary A migraine headache is an intense, throbbing pain on one side or both sides of the head. Migraines may also cause other symptoms, such as nausea, vomiting, and sensitivity to light and noise. This condition may be treated with medicines and lifestyle changes. You may also need to avoid certain  things that trigger a migraine headache. Keep a journal to find out what may trigger your migraine headaches. Contact your health care provider if you have more than 15 headache days in a month or you develop symptoms that are different or more severe than your usual migraine headache symptoms. This information is not intended to replace advice given to you by your health care provider. Make sure you discuss any questions you have with your health care provider. Document Revised: 01/14/2019 Document Reviewed: 11/04/2018 Elsevier Patient Education  [place].

## 2021-08-05 ENCOUNTER — Encounter: Payer: Self-pay | Admitting: Nurse Practitioner

## 2021-08-05 ENCOUNTER — Other Ambulatory Visit: Payer: Self-pay

## 2021-08-05 ENCOUNTER — Ambulatory Visit (INDEPENDENT_AMBULATORY_CARE_PROVIDER_SITE_OTHER): Payer: 59 | Admitting: Nurse Practitioner

## 2021-08-05 VITALS — BP 132/84 | HR 72 | Temp 98.3°F | Resp 10 | Ht 63.0 in | Wt 146.5 lb

## 2021-08-05 DIAGNOSIS — R350 Frequency of micturition: Secondary | ICD-10-CM | POA: Diagnosis not present

## 2021-08-05 DIAGNOSIS — N3090 Cystitis, unspecified without hematuria: Secondary | ICD-10-CM

## 2021-08-05 DIAGNOSIS — R519 Headache, unspecified: Secondary | ICD-10-CM

## 2021-08-05 DIAGNOSIS — R52 Pain, unspecified: Secondary | ICD-10-CM

## 2021-08-05 DIAGNOSIS — R051 Acute cough: Secondary | ICD-10-CM

## 2021-08-05 LAB — POCT URINALYSIS DIP (CLINITEK)
Bilirubin, UA: NEGATIVE
Glucose, UA: NEGATIVE mg/dL
Ketones, POC UA: NEGATIVE mg/dL
Nitrite, UA: NEGATIVE
POC PROTEIN,UA: NEGATIVE
Spec Grav, UA: 1.015 (ref 1.010–1.025)
Urobilinogen, UA: 0.2 E.U./dL
pH, UA: 7 (ref 5.0–8.0)

## 2021-08-05 LAB — POCT INFLUENZA A/B
Influenza A, POC: NEGATIVE
Influenza B, POC: NEGATIVE

## 2021-08-05 LAB — URINALYSIS, MICROSCOPIC ONLY: RBC / HPF: NONE SEEN (ref 0–?)

## 2021-08-05 MED ORDER — NITROFURANTOIN MONOHYD MACRO 100 MG PO CAPS: 100.0000 mg | ORAL_CAPSULE | Freq: Two times a day (BID) | ORAL | 0 refills | Status: AC

## 2021-08-05 NOTE — Assessment & Plan Note (Signed)
Given constellation of symptoms likely being an upper respiratory infection doing a rule out COVID and flu.  Did discuss with patient that she needs to quarantine until both test returned.  Did inform her that that is likely 36 to 48 hours for the COVID swab.  Patient acknowledged and on board with plan.  Flu swab did come back negative in office.  Pending COVID swab.  Continue to monitor

## 2021-08-05 NOTE — Patient Instructions (Addendum)
Nice to see you today Need to quarantine until the Covid test comes back, approx 48 hours. Will be in touch with results Will send in antibiotic for your urinary infection and will let you know if any change is needed

## 2021-08-05 NOTE — Assessment & Plan Note (Signed)
Looking at UA in office indicative of urinary tract infection.  Patient has history of same.  Did discuss we will go ahead and treat while we await urine microscopy and urine culture.  Patient agrees with plan.  She not having any discomfort with urination so will not prescribe any Pyridium at this time.  Continue to monitor

## 2021-08-05 NOTE — Assessment & Plan Note (Signed)
Given constellation of symptoms likely being an upper respiratory infection doing a rule out COVID and flu.  Did discuss with patient that she needs to quarantine until both test returned.  Did inform her that that is likely 36 to 48 hours for the COVID swab.  Patient acknowledged and on board with plan.  Flu swab did come back negative in office.  Pending COVID swab.  Continue to monitor  Patient did mention a note that she does have a history of migraines but this headache is different and a lot more tolerable.  We will continue to monitor
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
## 2021-08-06 LAB — URINE CULTURE
MICRO NUMBER:: 12572471
Result:: NO GROWTH
SPECIMEN QUALITY:: ADEQUATE

## 2021-08-06 LAB — NOVEL CORONAVIRUS, NAA: SARS-CoV-2, NAA: NOT DETECTED

## 2021-08-06 LAB — SARS-COV-2, NAA 2 DAY TAT

## 2021-08-09 ENCOUNTER — Other Ambulatory Visit: Payer: Self-pay | Admitting: Obstetrics & Gynecology

## 2021-08-09 DIAGNOSIS — Z1231 Encounter for screening mammogram for malignant neoplasm of breast: Secondary | ICD-10-CM

## 2021-09-05 ENCOUNTER — Encounter: Payer: Self-pay | Admitting: Internal Medicine
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
## 2021-09-05 NOTE — Patient Instructions (Addendum)
Blood work was ordered.     Medications changes include :   none   A Holter monitor was ordered   Please followup in 1 year   Health Maintenance, Female Adopting a healthy lifestyle and getting preventive care are important in promoting health and wellness. Ask your health care provider about: The right schedule for you to have regular tests and exams. Things you can do on your own to prevent diseases and keep yourself healthy. What should I know about diet, weight, and exercise? Eat a healthy diet  Eat a diet that includes plenty of vegetables, fruits, low-fat dairy products, and lean protein. Do not eat a lot of foods that are high in solid fats, added sugars, or sodium. Maintain a healthy weight Body mass index (BMI) is used to identify weight problems. It estimates body fat based on height and weight. Your health care provider can help determine your BMI and help you achieve or maintain a healthy weight. Get regular exercise Get regular exercise. This is one of the most important things you can do for your health. Most adults should: Exercise for at least 150 minutes each week. The exercise should increase your heart rate and make you sweat (moderate-intensity exercise). Do strengthening exercises at least twice a week. This is in addition to the moderate-intensity exercise. Spend less time sitting. Even light physical activity can be beneficial. Watch cholesterol and blood lipids Have your blood tested for lipids and cholesterol at 42 years of age, then have this test every 5 years. Have your cholesterol levels checked more often if: Your lipid or cholesterol levels are high. You are older than 42 years of age. You are at high risk for heart disease. What should I know about cancer screening? Depending on your health history and family history, you may need to have cancer screening at various ages. This may include screening for: Breast cancer. Cervical cancer. Colorectal  cancer. Skin cancer. Lung cancer. What should I know about heart disease, diabetes, and high blood pressure? Blood pressure and heart disease High blood pressure causes heart disease and increases the risk of stroke. This is more likely to develop in people who have high blood pressure readings or are overweight. Have your blood pressure checked: Every 3-5 years if you are 9-62 years of age. Every year if you are 59 years old or older. Diabetes Have regular diabetes screenings. This checks your fasting blood sugar level. Have the screening done: Once every three years after age 25 if you are at a normal weight and have a low risk for diabetes. More often and at a younger age if you are overweight or have a high risk for diabetes. What should I know about preventing infection? Hepatitis B If you have a higher risk for hepatitis B, you should be screened for this virus. Talk with your health care provider to find out if you are at risk for hepatitis B infection. Hepatitis C Testing is recommended for: Everyone born from [birth year] through 1965. Anyone with known risk factors for hepatitis C. Sexually transmitted infections (STIs) Get screened for STIs, including gonorrhea and chlamydia, if: You are sexually active and are younger than 42 years of age. You are older than 42 years of age and your health care provider tells you that you are at risk for this type of infection. Your sexual activity has changed since you were last screened, and you are at increased risk for chlamydia or gonorrhea. Ask your health care provider if  you are at risk. Ask your health care provider about whether you are at high risk for HIV. Your health care provider may recommend a prescription medicine to help prevent HIV infection. If you choose to take medicine to prevent HIV, you should first get tested for HIV. You should then be tested every 3 months for as long as you are taking the medicine. Pregnancy If you are  about to stop having your period (premenopausal) and you may become pregnant, seek counseling before you get pregnant. Take 400 to 800 micrograms (mcg) of folic acid every day if you become pregnant. Ask for birth control (contraception) if you want to prevent pregnancy. Osteoporosis and menopause Osteoporosis is a disease in which the bones lose minerals and strength with aging. This can result in bone fractures. If you are 48 years old or older, or if you are at risk for osteoporosis and fractures, ask your health care provider if you should: Be screened for bone loss. Take a calcium or vitamin D supplement to lower your risk of fractures. Be given hormone replacement therapy (HRT) to treat symptoms of menopause. Follow these instructions at home: Alcohol use Do not drink alcohol if: Your health care provider tells you not to drink. You are pregnant, may be pregnant, or are planning to become pregnant. If you drink alcohol: Limit how much you have to: 0-1 drink a day. Know how much alcohol is in your drink. In the [place] one drink equals one 12 oz bottle of beer (355 mL), one 5 oz glass of wine (148 mL), or one 1 oz glass of hard liquor (44 mL). Lifestyle Do not use any products that contain nicotine or tobacco. These products include cigarettes, chewing tobacco, and vaping devices, such as e-cigarettes. If you need help quitting, ask your health care provider. Do not use street drugs. Do not share needles. Ask your health care provider for help if you need support or information about quitting drugs. General instructions Schedule regular health, dental, and eye exams. Stay current with your vaccines. Tell your health care provider if: You often feel depressed. You have ever been abused or do not feel safe at home. Summary Adopting a healthy lifestyle and getting preventive care are important in promoting health and wellness. Follow your health care provider's instructions about  healthy diet, exercising, and getting tested or screened for diseases. Follow your health care provider's instructions on monitoring your cholesterol and blood pressure. This information is not intended to replace advice given to you by your health care provider. Make sure you discuss any questions you have with your health care provider. Document Revised: 02/11/2021 Document Reviewed: 02/11/2021 Elsevier Patient Education  [place].

## 2021-09-06 ENCOUNTER — Other Ambulatory Visit: Payer: Self-pay

## 2021-09-06 ENCOUNTER — Ambulatory Visit (INDEPENDENT_AMBULATORY_CARE_PROVIDER_SITE_OTHER): Payer: 59 | Admitting: Internal Medicine

## 2021-09-06 ENCOUNTER — Ambulatory Visit (INDEPENDENT_AMBULATORY_CARE_PROVIDER_SITE_OTHER): Payer: 59

## 2021-09-06 VITALS — BP 104/78 | HR 98 | Temp 98.0°F | Ht 63.0 in | Wt 146.0 lb

## 2021-09-06 DIAGNOSIS — R252 Cramp and spasm: Secondary | ICD-10-CM | POA: Insufficient documentation

## 2021-09-06 DIAGNOSIS — L508 Other urticaria: Secondary | ICD-10-CM

## 2021-09-06 DIAGNOSIS — R002 Palpitations: Secondary | ICD-10-CM

## 2021-09-06 DIAGNOSIS — Z1159 Encounter for screening for other viral diseases: Secondary | ICD-10-CM

## 2021-09-06 DIAGNOSIS — E538 Deficiency of other specified B group vitamins: Secondary | ICD-10-CM

## 2021-09-06 DIAGNOSIS — K219 Gastro-esophageal reflux disease without esophagitis: Secondary | ICD-10-CM

## 2021-09-06 DIAGNOSIS — G43009 Migraine without aura, not intractable, without status migrainosus: Secondary | ICD-10-CM

## 2021-09-06 DIAGNOSIS — Z0000 Encounter for general adult medical examination without abnormal findings: Secondary | ICD-10-CM | POA: Diagnosis not present

## 2021-09-06 DIAGNOSIS — R739 Hyperglycemia, unspecified: Secondary | ICD-10-CM

## 2021-09-06 DIAGNOSIS — E559 Vitamin D deficiency, unspecified: Secondary | ICD-10-CM | POA: Diagnosis not present

## 2021-09-06 LAB — COMPREHENSIVE METABOLIC PANEL
ALT: 15 U/L (ref 0–35)
AST: 20 U/L (ref 0–37)
Albumin: 4.6 g/dL (ref 3.5–5.2)
Alkaline Phosphatase: 68 U/L (ref 39–117)
BUN: 10 mg/dL (ref 6–23)
CO2: 28 mEq/L (ref 19–32)
Calcium: 9.4 mg/dL (ref 8.4–10.5)
Chloride: 102 mEq/L (ref 96–112)
Creatinine, Ser: 0.74 mg/dL (ref 0.40–1.20)
GFR: 99.72 mL/min (ref >60.00)
Glucose, Bld: 79 mg/dL (ref 70–99)
Potassium: 3.8 mEq/L (ref 3.5–5.1)
Sodium: 136 mEq/L (ref 135–145)
Total Bilirubin: 0.5 mg/dL (ref 0.2–1.2)
Total Protein: 7.4 g/dL (ref 6.0–8.3)

## 2021-09-06 LAB — LIPID PANEL
Cholesterol: 163 mg/dL (ref 0–200)
HDL: 68.9 mg/dL (ref >39.00)
LDL Cholesterol: 78 mg/dL (ref 0–99)
NonHDL: 94.5
Total CHOL/HDL Ratio: 2
Triglycerides: 85 mg/dL (ref 0.0–149.0)
VLDL: 17 mg/dL (ref 0.0–40.0)

## 2021-09-06 LAB — CBC WITH DIFFERENTIAL/PLATELET
Basophils Absolute: 0 10*3/uL (ref 0.0–0.1)
Basophils Relative: 0.4 % (ref 0.0–3.0)
Eosinophils Absolute: 0.3 10*3/uL (ref 0.0–0.7)
Eosinophils Relative: 4.5 % (ref 0.0–5.0)
HCT: 40.1 % (ref 36.0–46.0)
Hemoglobin: 13.4 g/dL (ref 12.0–15.0)
Lymphocytes Relative: 28.1 % (ref 12.0–46.0)
Lymphs Abs: 1.8 10*3/uL (ref 0.7–4.0)
MCHC: 33.5 g/dL (ref 30.0–36.0)
MCV: 90.7 fl (ref 78.0–100.0)
Monocytes Absolute: 0.5 10*3/uL (ref 0.1–1.0)
Monocytes Relative: 7.3 % (ref 3.0–12.0)
Neutro Abs: 3.8 10*3/uL (ref 1.4–7.7)
Neutrophils Relative %: 59.7 % (ref 43.0–77.0)
Platelets: 220 10*3/uL (ref 150.0–400.0)
RBC: 4.43 Mil/uL (ref 3.87–5.11)
RDW: 12.6 % (ref 11.5–15.5)
WBC: 6.4 10*3/uL (ref 4.0–10.5)

## 2021-09-06 LAB — TSH: TSH: 1.91 u[IU]/mL (ref 0.35–5.50)

## 2021-09-06 LAB — VITAMIN D 25 HYDROXY (VIT D DEFICIENCY, FRACTURES): VITD: 42.39 ng/mL (ref 30.00–100.00)

## 2021-09-06 LAB — HEMOGLOBIN A1C: Hgb A1c MFr Bld: 5.6 % (ref 4.6–6.5)

## 2021-09-06 LAB — MAGNESIUM: Magnesium: 2 mg/dL (ref 1.5–2.5)

## 2021-09-06 LAB — VITAMIN B12: Vitamin B-12: 1283 pg/mL — ABNORMAL HIGH (ref 211–911)

## 2021-09-06 NOTE — Assessment & Plan Note (Signed)
Chronic Can occur with sitting or laying down She is very active and working 2 jobs She drinks plenty of water Denies restless leg symptoms Unknown cause CMP, magnesium

## 2021-09-06 NOTE — Progress Notes (Unsigned)
Enrolled patient for a 14 day Zio XT  monitor to be mailed to patients home  °

## 2021-09-06 NOTE — Assessment & Plan Note (Signed)
Chronic Taking vitamin D daily Check vitamin D level  

## 2021-09-06 NOTE — Assessment & Plan Note (Signed)
Chronic Check a1c Low sugar / carb diet Stressed regular exercise  

## 2021-09-06 NOTE — Assessment & Plan Note (Addendum)
Chronic Following with allergy Unknown cause Controlled but has take daily medication Taking Zyrtec 10 mg nightly or Benadryl nightly

## 2021-09-06 NOTE — Assessment & Plan Note (Addendum)
Chronic Better She knows not getting enough rest is a trigger Continue Zofran 4 mg as needed for migraines Continue Zomig 5 mg as needed for migraines

## 2021-09-06 NOTE — Assessment & Plan Note (Signed)
Acute Symptoms at rest experiencing episodes of palpitations, lightheadedness, feeling of her blood pressure of blood rising up in her body ?  Anxiety versus arrhythmia versus other Her symptoms of a history of Wolff-Parkinson-White Cardiac exam normal New order 2-week monitor to evaluate for arrhythmia Labs today CBC, CMP, TSH

## 2021-09-06 NOTE — Assessment & Plan Note (Addendum)
Chronic Taking B vitamin daily Check B12 level

## 2021-09-06 NOTE — Assessment & Plan Note (Signed)
Chronic, intermittent Does experience some GERD at night-typically when she works late and has to eat before going to bed, which is impossible to avoid Can take Pepcid nightly or Tums nightly Advised finding earlier Can elevate head of the bed Discussed that she needs to make sure this is controlled-does have history of a nonbleeding gastric ulcer by EGD in 2021

## 2021-09-10 LAB — HEPATITIS C ANTIBODY
Hepatitis C Ab: NONREACTIVE
SIGNAL TO CUT-OFF: <0.02 (ref <1.00)

## 2021-09-13 ENCOUNTER — Ambulatory Visit
Admission: RE | Admit: 2021-09-13 | Discharge: 2021-09-13 | Disposition: A | Discharge status: Home / Self Care | Payer: 59 | Source: Ambulatory Visit | Attending: Obstetrics & Gynecology | Admitting: Obstetrics & Gynecology

## 2021-09-13 DIAGNOSIS — R002 Palpitations: Secondary | ICD-10-CM

## 2021-09-13 DIAGNOSIS — Z1231 Encounter for screening mammogram for malignant neoplasm of breast: Secondary | ICD-10-CM

## 2022-07-09 NOTE — Progress Notes (Unsigned)
    Subjective:    Patient ID: Kelly Figueroa, female    DOB: 22-Sep-1981, 41 y.o.   MRN: [id number]      HPI Kelly Figueroa is here for No chief complaint on file.    Bump on leg    Medications and allergies reviewed with patient and updated if appropriate.  Current Outpatient Medications on File Prior to Visit  Medication Sig Dispense Refill   albuterol (VENTOLIN HFA) 108 (90 Base) MCG/ACT inhaler Inhale 2 puffs into the lungs every 6 (six) hours as needed for wheezing or shortness of breath. 8 g 5   BIOTIN PO Take 10,000 tablets by mouth.     cetirizine (ZYRTEC) 10 MG tablet 1 tablet     Cholecalciferol (VITAMIN D) 50 MCG (2000 UT) tablet Take 1 tablet (2,000 Units total) by mouth daily.     diphenhydrAMINE (BENADRYL) 25 MG tablet Take 50 mg by mouth 2 (two) times daily as needed (allergic reaction).     EPINEPHrine 0.3 mg/0.3 mL IJ SOAJ injection Inject 0.3 mg into the muscle as needed for anaphylaxis. 1 each 0   ondansetron (ZOFRAN ODT) 4 MG disintegrating tablet Take 1 tablet (4 mg total) by mouth every 8 (eight) hours as needed for nausea (nausea with migraines). 20 tablet 0   Spacer/Aero-Holding Chambers (AEROCHAMBER PLUS) inhaler Use as instructed 1 each 0   zolmitriptan (ZOMIG ZMT) 5 MG disintegrating tablet Take 1 tablet (5 mg total) by mouth as needed for migraine. May repeat dose once after 2 hrs if needed 10 tablet 5   No current facility-administered medications on file prior to visit.    Review of Systems     Objective:  There were no vitals filed for this visit. BP Readings from Last 3 Encounters:  09/06/21 104/78  08/05/21 132/84  06/27/21 112/80   Wt Readings from Last 3 Encounters:  09/06/21 146 lb (66.2 kg)  08/05/21 146 lb 8 oz (66.5 kg)  06/27/21 143 lb (64.9 kg)   There is no height or weight on file to calculate BMI.    Physical Exam         Assessment & Plan:    See Problem List for Assessment and Plan of chronic medical problems.

## 2022-07-10 ENCOUNTER — Ambulatory Visit (INDEPENDENT_AMBULATORY_CARE_PROVIDER_SITE_OTHER): Payer: 59 | Admitting: Internal Medicine

## 2022-07-10 ENCOUNTER — Encounter: Payer: Self-pay | Admitting: Internal Medicine

## 2022-07-10 DIAGNOSIS — T148XXA Other injury of unspecified body region, initial encounter: Secondary | ICD-10-CM

## 2022-07-10 NOTE — Assessment & Plan Note (Signed)
Acute Area in posterior left lower leg likely residual hematoma - bruise has almost completely faded away - residual lump will likely go away after another 1-2 weeks She will monitor for now and if it does not go away or gets large/ becomes more painful we will consider an [place]

## 2022-07-10 NOTE — Patient Instructions (Addendum)
    If your lump bump does not go away or gets larger over the next several weeks let me now and we will do an ultrasound.

## 2022-08-01 ENCOUNTER — Other Ambulatory Visit: Payer: Self-pay | Admitting: Internal Medicine

## 2022-08-01 DIAGNOSIS — Z1231 Encounter for screening mammogram for malignant neoplasm of breast: Secondary | ICD-10-CM

## 2022-09-05 ENCOUNTER — Encounter: Payer: 59 | Admitting: Internal Medicine

## 2022-09-08 ENCOUNTER — Encounter: Payer: Self-pay | Admitting: Internal Medicine

## 2022-09-08 NOTE — Progress Notes (Signed)
Subjective:    Patient ID: Kelly Figueroa, female    DOB: 01/07/1981, 41 y.o.   MRN: [id number]      HPI Charitie is here for a Physical exam.    Taking benadryl nightly for rash/hives - this prevents the rash - if she skips a night she will get the rash.  She will hives throughout the body.  Concerned about benadryl causing other issues.  Eats very bland and has removed many things from her diet.   Medications and allergies reviewed with patient and updated if appropriate.  Current Outpatient Medications on File Prior to Visit  Medication Sig Dispense Refill   albuterol (VENTOLIN HFA) 108 (90 Base) MCG/ACT inhaler Inhale 2 puffs into the lungs every 6 (six) hours as needed for wheezing or shortness of breath. 8 g 5   BIOTIN PO Take 10,000 tablets by mouth.     Cholecalciferol (VITAMIN D) 50 MCG (2000 UT) tablet Take 1 tablet (2,000 Units total) by mouth daily.     Spacer/Aero-Holding Chambers (AEROCHAMBER PLUS) inhaler Use as instructed 1 each 0   No current facility-administered medications on file prior to visit.    Review of Systems  Constitutional:  Negative for fever.  Eyes:  Negative for visual disturbance.  Respiratory:  Negative for cough, shortness of breath and wheezing.   Cardiovascular:  Negative for chest pain, palpitations and leg swelling.  Gastrointestinal:  Positive for constipation (chronic). Negative for abdominal pain, blood in stool, diarrhea and nausea.       Gerd  Genitourinary:  Negative for dysuria.  Musculoskeletal:  Negative for arthralgias and back pain.  Skin:  Positive for rash (recurrent urticaria - ? cause - taking benadryl nightly).  Neurological:  Positive for headaches. Negative for light-headedness.  Psychiatric/Behavioral:  Negative for dysphoric mood. The patient is nervous/anxious (controlled).        Objective:   Vitals:   09/09/22 0921  BP: 104/78  Pulse: 74  Temp: 98.2 F (36.8 C)  SpO2: 99%   Filed Weights    09/09/22 0921  Weight: 147 lb (66.7 kg)   Body mass index is 26.04 kg/m.  BP Readings from Last 3 Encounters:  09/09/22 104/78  07/10/22 108/72  09/06/21 104/78    Wt Readings from Last 3 Encounters:  09/09/22 147 lb (66.7 kg)  07/10/22 150 lb 6.4 oz (68.2 kg)  09/06/21 146 lb (66.2 kg)       Physical Exam Constitutional: She appears well-developed and well-nourished. No distress.  HENT:  Head: Normocephalic and atraumatic.  Right Ear: External ear normal. Normal ear canal and TM Left Ear: External ear normal.  Normal ear canal and TM Mouth/Throat: Oropharynx is clear and moist.  Eyes: Conjunctivae normal.  Neck: Neck supple. No tracheal deviation present. No thyromegaly present.  No carotid bruit  Cardiovascular: Normal rate, regular rhythm and normal heart sounds.   No murmur heard.  No edema. Pulmonary/Chest: Effort normal and breath sounds normal. No respiratory distress. She has no wheezes. She has no rales.  Breast: deferred   Abdominal: Soft. She exhibits no distension. There is no tenderness.  Lymphadenopathy: She has no cervical adenopathy.  Skin: Skin is warm and dry. She is not diaphoretic.  Psychiatric: She has a normal mood and affect. Her behavior is normal.     Lab Results  Component Value Date   WBC 6.4 09/06/2021   HGB 13.4 09/06/2021   HCT 40.1 09/06/2021   PLT 220.0 09/06/2021   GLUCOSE 79 09/06/2021  CHOL 163 09/06/2021   TRIG 85.0 09/06/2021   HDL 68.90 09/06/2021   LDLCALC 78 09/06/2021   ALT 15 09/06/2021   AST 20 09/06/2021   NA 136 09/06/2021   K 3.8 09/06/2021   CL 102 09/06/2021   CREATININE 0.74 09/06/2021   BUN 10 09/06/2021   CO2 28 09/06/2021   TSH 1.91 09/06/2021   HGBA1C 5.6 09/06/2021         Assessment & Plan:   Physical exam: Screening blood work  ordered Exercise  regular Weight  good Substance abuse  none   Reviewed recommended immunizations.   Health Maintenance  Topic Date Due   COVID-19 Vaccine  (7 - 2023-24 season) 09/25/2022 (Originally 06/06/2022)   INFLUENZA VACCINE  01/04/2023 (Originally 05/06/2022)   MAMMOGRAM  09/13/2022   PAP SMEAR-Modifier  01/22/2023   DTaP/Tdap/Td (2 - Td or Tdap) 08/23/2028   Hepatitis C Screening  Completed   HIV Screening  Completed   HPV VACCINES  Aged Out          See Problem List for Assessment and Plan of chronic medical problems.

## 2022-09-08 NOTE — Patient Instructions (Addendum)
Blood work was ordered.   The lab is on the first floor.    Medications changes include :   None    Return in about 1 year (around 09/10/2023) for Physical Exam.    Health Maintenance, Female Adopting a healthy lifestyle and getting preventive care are important in promoting health and wellness. Ask your health care provider about: The right schedule for you to have regular tests and exams. Things you can do on your own to prevent diseases and keep yourself healthy. What should I know about diet, weight, and exercise? Eat a healthy diet  Eat a diet that includes plenty of vegetables, fruits, low-fat dairy products, and lean protein. Do not eat a lot of foods that are high in solid fats, added sugars, or sodium. Maintain a healthy weight Body mass index (BMI) is used to identify weight problems. It estimates body fat based on height and weight. Your health care provider can help determine your BMI and help you achieve or maintain a healthy weight. Get regular exercise Get regular exercise. This is one of the most important things you can do for your health. Most adults should: Exercise for at least 150 minutes each week. The exercise should increase your heart rate and make you sweat (moderate-intensity exercise). Do strengthening exercises at least twice a week. This is in addition to the moderate-intensity exercise. Spend less time sitting. Even light physical activity can be beneficial. Watch cholesterol and blood lipids Have your blood tested for lipids and cholesterol at 43 years of age, then have this test every 5 years. Have your cholesterol levels checked more often if: Your lipid or cholesterol levels are high. You are older than 43 years of age. You are at high risk for heart disease. What should I know about cancer screening? Depending on your health history and family history, you may need to have cancer screening at various ages. This may include screening  for: Breast cancer. Cervical cancer. Colorectal cancer. Skin cancer. Lung cancer. What should I know about heart disease, diabetes, and high blood pressure? Blood pressure and heart disease High blood pressure causes heart disease and increases the risk of stroke. This is more likely to develop in people who have high blood pressure readings or are overweight. Have your blood pressure checked: Every 3-5 years if you are 55-36 years of age. Every year if you are 22 years old or older. Diabetes Have regular diabetes screenings. This checks your fasting blood sugar level. Have the screening done: Once every three years after age 72 if you are at a normal weight and have a low risk for diabetes. More often and at a younger age if you are overweight or have a high risk for diabetes. What should I know about preventing infection? Hepatitis B If you have a higher risk for hepatitis B, you should be screened for this virus. Talk with your health care provider to find out if you are at risk for hepatitis B infection. Hepatitis C Testing is recommended for: Everyone born from [birth year] through 1967. Anyone with known risk factors for hepatitis C. Sexually transmitted infections (STIs) Get screened for STIs, including gonorrhea and chlamydia, if: You are sexually active and are younger than 43 years of age. You are older than 43 years of age and your health care provider tells you that you are at risk for this type of infection. Your sexual activity has changed since you were last screened, and you are at  increased risk for chlamydia or gonorrhea. Ask your health care provider if you are at risk. Ask your health care provider about whether you are at high risk for HIV. Your health care provider may recommend a prescription medicine to help prevent HIV infection. If you choose to take medicine to prevent HIV, you should first get tested for HIV. You should then be tested every 3 months for as long as you  are taking the medicine. Pregnancy If you are about to stop having your period (premenopausal) and you may become pregnant, seek counseling before you get pregnant. Take 400 to 800 micrograms (mcg) of folic acid every day if you become pregnant. Ask for birth control (contraception) if you want to prevent pregnancy. Osteoporosis and menopause Osteoporosis is a disease in which the bones lose minerals and strength with aging. This can result in bone fractures. If you are 62 years old or older, or if you are at risk for osteoporosis and fractures, ask your health care provider if you should: Be screened for bone loss. Take a calcium or vitamin D supplement to lower your risk of fractures. Be given hormone replacement therapy (HRT) to treat symptoms of menopause. Follow these instructions at home: Alcohol use Do not drink alcohol if: Your health care provider tells you not to drink. You are pregnant, may be pregnant, or are planning to become pregnant. If you drink alcohol: Limit how much you have to: 0-1 drink a day. Know how much alcohol is in your drink. In the [place] one drink equals one 12 oz bottle of beer (355 mL), one 5 oz glass of wine (148 mL), or one 1 oz glass of hard liquor (44 mL). Lifestyle Do not use any products that contain nicotine or tobacco. These products include cigarettes, chewing tobacco, and vaping devices, such as e-cigarettes. If you need help quitting, ask your health care provider. Do not use street drugs. Do not share needles. Ask your health care provider for help if you need support or information about quitting drugs. General instructions Schedule regular health, dental, and eye exams. Stay current with your vaccines. Tell your health care provider if: You often feel depressed. You have ever been abused or do not feel safe at home. Summary Adopting a healthy lifestyle and getting preventive care are important in promoting health and wellness. Follow your  health care provider's instructions about healthy diet, exercising, and getting tested or screened for diseases. Follow your health care provider's instructions on monitoring your cholesterol and blood pressure. This information is not intended to replace advice given to you by your health care provider. Make sure you discuss any questions you have with your health care provider. Document Revised: 02/11/2021 Document Reviewed: 02/11/2021 Elsevier Patient Education  [employer].

## 2022-09-09 ENCOUNTER — Ambulatory Visit (INDEPENDENT_AMBULATORY_CARE_PROVIDER_SITE_OTHER): Payer: 59 | Admitting: Internal Medicine

## 2022-09-09 VITALS — BP 104/78 | HR 74 | Temp 98.2°F | Ht 63.0 in | Wt 147.0 lb

## 2022-09-09 DIAGNOSIS — K219 Gastro-esophageal reflux disease without esophagitis: Secondary | ICD-10-CM

## 2022-09-09 DIAGNOSIS — Z0000 Encounter for general adult medical examination without abnormal findings: Secondary | ICD-10-CM | POA: Diagnosis not present

## 2022-09-09 DIAGNOSIS — G43009 Migraine without aura, not intractable, without status migrainosus: Secondary | ICD-10-CM | POA: Diagnosis not present

## 2022-09-09 DIAGNOSIS — E538 Deficiency of other specified B group vitamins: Secondary | ICD-10-CM | POA: Diagnosis not present

## 2022-09-09 DIAGNOSIS — R739 Hyperglycemia, unspecified: Secondary | ICD-10-CM

## 2022-09-09 DIAGNOSIS — E559 Vitamin D deficiency, unspecified: Secondary | ICD-10-CM | POA: Diagnosis not present

## 2022-09-09 LAB — COMPREHENSIVE METABOLIC PANEL
ALT: 14 U/L (ref 0–35)
AST: 19 U/L (ref 0–37)
Albumin: 4.7 g/dL (ref 3.5–5.2)
Alkaline Phosphatase: 71 U/L (ref 39–117)
BUN: 12 mg/dL (ref 6–23)
CO2: 26 mEq/L (ref 19–32)
Calcium: 9.2 mg/dL (ref 8.4–10.5)
Chloride: 105 mEq/L (ref 96–112)
Creatinine, Ser: 0.76 mg/dL (ref 0.40–1.20)
GFR: 95.9 mL/min (ref >60.00)
Glucose, Bld: 97 mg/dL (ref 70–99)
Potassium: 4.1 mEq/L (ref 3.5–5.1)
Sodium: 137 mEq/L (ref 135–145)
Total Bilirubin: 0.5 mg/dL (ref 0.2–1.2)
Total Protein: 7.5 g/dL (ref 6.0–8.3)

## 2022-09-09 LAB — LIPID PANEL
Cholesterol: 145 mg/dL (ref 0–200)
HDL: 65.5 mg/dL (ref >39.00)
LDL Cholesterol: 65 mg/dL (ref 0–99)
NonHDL: 79.51
Total CHOL/HDL Ratio: 2
Triglycerides: 73 mg/dL (ref 0.0–149.0)
VLDL: 14.6 mg/dL (ref 0.0–40.0)

## 2022-09-09 LAB — CBC WITH DIFFERENTIAL/PLATELET
Basophils Absolute: 0 10*3/uL (ref 0.0–0.1)
Basophils Relative: 0.3 % (ref 0.0–3.0)
Eosinophils Absolute: 0.2 10*3/uL (ref 0.0–0.7)
Eosinophils Relative: 2.5 % (ref 0.0–5.0)
HCT: 40.9 % (ref 36.0–46.0)
Hemoglobin: 14 g/dL (ref 12.0–15.0)
Lymphocytes Relative: 24.1 % (ref 12.0–46.0)
Lymphs Abs: 1.6 10*3/uL (ref 0.7–4.0)
MCHC: 34.2 g/dL (ref 30.0–36.0)
MCV: 90.3 fl (ref 78.0–100.0)
Monocytes Absolute: 0.4 10*3/uL (ref 0.1–1.0)
Monocytes Relative: 6 % (ref 3.0–12.0)
Neutro Abs: 4.5 10*3/uL (ref 1.4–7.7)
Neutrophils Relative %: 67.1 % (ref 43.0–77.0)
Platelets: 200 10*3/uL (ref 150.0–400.0)
RBC: 4.52 Mil/uL (ref 3.87–5.11)
RDW: 12.3 % (ref 11.5–15.5)
WBC: 6.7 10*3/uL (ref 4.0–10.5)

## 2022-09-09 LAB — TSH: TSH: 2.39 u[IU]/mL (ref 0.35–5.50)

## 2022-09-09 LAB — VITAMIN D 25 HYDROXY (VIT D DEFICIENCY, FRACTURES): VITD: 48.51 ng/mL (ref 30.00–100.00)

## 2022-09-09 LAB — VITAMIN B12: Vitamin B-12: 850 pg/mL (ref 211–911)

## 2022-09-09 NOTE — Assessment & Plan Note (Signed)
Chronic Will check B12 level

## 2022-09-09 NOTE — Assessment & Plan Note (Signed)
Chronic Taking vitamin D daily Check vitamin D level  

## 2022-09-09 NOTE — Assessment & Plan Note (Signed)
Chronic Check a1c Low sugar / carb diet Stressed regular exercise  

## 2022-09-09 NOTE — Assessment & Plan Note (Signed)
Chronic Occasional migraines Takes otc medication prn

## 2022-09-09 NOTE — Assessment & Plan Note (Signed)
Chronic GERD intermittent - controlled with pepcid otc prn Continue pepcid otc 20 mg daily prn

## 2022-09-26 ENCOUNTER — Ambulatory Visit
Admission: RE | Admit: 2022-09-26 | Discharge: 2022-09-26 | Disposition: A | Discharge status: Home / Self Care | Payer: 59 | Source: Ambulatory Visit | Attending: Internal Medicine | Admitting: Internal Medicine

## 2022-09-26 DIAGNOSIS — Z1231 Encounter for screening mammogram for malignant neoplasm of breast: Secondary | ICD-10-CM

## 2022-10-07 ENCOUNTER — Telehealth: Payer: Managed Care, Other (non HMO) | Admitting: Family Medicine

## 2022-10-07 DIAGNOSIS — J4521 Mild intermittent asthma with (acute) exacerbation: Secondary | ICD-10-CM

## 2022-10-07 MED ORDER — PREDNISONE 20 MG PO TABS: 40.0000 mg | ORAL_TABLET | Freq: Every day | ORAL | 0 refills | Status: AC

## 2022-10-07 MED ORDER — PSEUDOEPH-BROMPHEN-DM 30-2-10 MG/5ML PO SYRP: 5.0000 mL | ORAL_SOLUTION | Freq: Three times a day (TID) | ORAL | 0 refills | Status: DC | PRN

## 2022-10-07 MED ORDER — FLUTICASONE PROPIONATE 50 MCG/ACT NA SUSP: 2.0000 | Freq: Every day | NASAL | 0 refills | Status: DC

## 2022-10-07 MED ORDER — BENZONATATE 100 MG PO CAPS: 200.0000 mg | ORAL_CAPSULE | Freq: Two times a day (BID) | ORAL | 0 refills | Status: DC | PRN

## 2022-10-07 NOTE — Patient Instructions (Signed)
Kelly Figueroa, thank you for joining [name], NP for today's virtual visit.  While this provider is not your primary care provider (PCP), if your PCP is located in our provider database this encounter information will be shared with them immediately following your visit.   [facility] account gives you access to today's visit and all your visits, tests, and labs performed at [facility] " click here if you don't have a [facility] account or go to [email or web address]  Consent: (Patient) Kelly Figueroa provided verbal consent for this virtual visit at the beginning of the encounter.  Current Medications:  Current Outpatient Medications:    benzonatate (TESSALON) 100 MG capsule, Take 2 capsules (200 mg total) by mouth 2 (two) times daily as needed for cough., Disp: 20 capsule, Rfl: 0   brompheniramine-pseudoephedrine-DM 30-2-10 MG/5ML syrup, Take 5 mLs by mouth 3 (three) times daily as needed., Disp: 120 mL, Rfl: 0   fluticasone (FLONASE) 50 MCG/ACT nasal spray, Place 2 sprays into both nostrils daily., Disp: 16 g, Rfl: 0   predniSONE (DELTASONE) 20 MG tablet, Take 2 tablets (40 mg total) by mouth daily with breakfast for 3 days., Disp: 6 tablet, Rfl: 0   albuterol (VENTOLIN HFA) 108 (90 Base) MCG/ACT inhaler, Inhale 2 puffs into the lungs every 6 (six) hours as needed for wheezing or shortness of breath., Disp: 8 g, Rfl: 5   BIOTIN PO, Take 10,000 tablets by mouth., Disp: , Rfl:    Cholecalciferol (VITAMIN D) 50 MCG (2000 UT) tablet, Take 1 tablet (2,000 Units total) by mouth daily., Disp: , Rfl:    Spacer/Aero-Holding Chambers (AEROCHAMBER PLUS) inhaler, Use as instructed, Disp: 1 each, Rfl: 0   Medications ordered in this encounter:  Meds ordered this encounter  Medications   predniSONE (DELTASONE) 20 MG tablet    Sig: Take 2 tablets (40 mg total) by mouth daily with breakfast for 3 days.    Dispense:  6 tablet    Refill:  0    Order  Specific Question:   Supervising Provider    Answer:   Chase Picket [[id number]]   benzonatate (TESSALON) 100 MG capsule    Sig: Take 2 capsules (200 mg total) by mouth 2 (two) times daily as needed for cough.    Dispense:  20 capsule    Refill:  0    Order Specific Question:   Supervising Provider    Answer:   Chase Picket [[id number]]   fluticasone (FLONASE) 50 MCG/ACT nasal spray    Sig: Place 2 sprays into both nostrils daily.    Dispense:  16 g    Refill:  0    Order Specific Question:   Supervising Provider    Answer:   Chase Picket [id number]   brompheniramine-pseudoephedrine-DM 30-2-10 MG/5ML syrup    Sig: Take 5 mLs by mouth 3 (three) times daily as needed.    Dispense:  120 mL    Refill:  0    Order Specific Question:   Supervising Provider    Answer:   Chase Picket [[id number]]     *If you need refills on other medications prior to your next appointment, please contact your pharmacy*  Follow-Up: Call back or seek an in-person evaluation if the symptoms worsen or if the condition fails to improve as anticipated.  [facility] [phone]  Other Instructions  - Take meds as prescribed - Rest voice - Use a cool mist humidifier especially during  the winter months when heat dries out the air. - Use saline nose sprays frequently to help soothe nasal passages if they are drying out. - Stay hydrated by drinking plenty of fluids - Keep thermostat turn down low to prevent drying out which can cause a dry cough. - For any cough or congestion- robitussin DM or Delsym as needed - For fever or aches or pains- take tylenol or ibuprofen as directed on bottle             * for fevers greater than 101 orally you may alternate ibuprofen and tylenol every 3 hours.               Please be seen in person if you feel you are not improving.   If you have been instructed to have an in-person evaluation today at a local Urgent Care facility, please use the link  below. It will take you to a list of all of our available [facility], including address, phone number and hours of operation. Please do not delay care.  [facility]  If you or a family member do not have a primary care provider, use the link below to schedule a visit and establish care. When you choose a [facility] primary care physician or advanced practice provider, you gain a long-term partner in health. Find a Primary Care Provider  Learn more about [facility]'s in-office and virtual care options: [facility]

## 2022-10-07 NOTE — Progress Notes (Signed)
Virtual Visit Consent   Kelly Figueroa, you are scheduled for a virtual visit with a [facility] provider today. Just as with appointments in the office, your consent must be obtained to participate. Your consent will be active for this visit and any virtual visit you may have with one of our providers in the next 365 days. If you have a MyChart account, a copy of this consent can be sent to you electronically.  As this is a virtual visit, video technology does not allow for your provider to perform a traditional examination. This may limit your provider's ability to fully assess your condition. If your provider identifies any concerns that need to be evaluated in person or the need to arrange testing (such as labs, EKG, etc.), we will make arrangements to do so. Although advances in technology are sophisticated, we cannot ensure that it will always work on either your end or our end. If the connection with a video visit is poor, the visit may have to be switched to a telephone visit. With either a video or telephone visit, we are not always able to ensure that we have a secure connection.  By engaging in this virtual visit, you consent to the provision of healthcare and authorize for your insurance to be billed (if applicable) for the services provided during this visit. Depending on your insurance coverage, you may receive a charge related to this service.  I need to obtain your verbal consent now. Are you willing to proceed with your visit today? [name] has provided verbal consent on 10/07/2022 for a virtual visit (video or telephone). [name], NP  Date: 10/07/2022 2:38 PM  Virtual Visit via Video Note   I, [name], connected with  Kelly Figueroa  ([id number], [birth year]) on 10/07/22 at  3:45 PM EST by a video-enabled telemedicine application and verified that I am speaking with the correct person using two identifiers.  Location: Patient: Virtual Visit Location  Patient: Home Provider: Virtual Visit Location Provider: Home Office   I discussed the limitations of evaluation and management by telemedicine and the availability of in person appointments. The patient expressed understanding and agreed to proceed.    History of Present Illness: Kelly Figueroa is a 42 y.o. who identifies as a female who was assigned female at birth, and is being seen today for sinus symptoms. Congestion, sneezing, itchy throat and ears, and sinus pain. Onset was a few days.  Associated symptoms tired, achy, cough at night, feels wheezing, and windedness at times. Denies unsure of fevers, chills, chest pain.  Modifying factors inhaler- known asthma history  Problems:  Patient Active Problem List   Diagnosis Date Noted   Hematoma 07/10/2022   Muscle cramping 09/06/2021   Palpitations 09/06/2021   GERD (gastroesophageal reflux disease) 09/06/2021   Cystitis 08/05/2021   Generalized headaches 08/05/2021   Body aches 08/05/2021   Acute cough 08/05/2021   Hyperglycemia 10/15/2020   Recurrent urticaria 08/29/2020   Migraine headache without aura 05/30/2020   Vitamin D deficiency 08/25/2019   Vitamin B12 deficiency 08/25/2019   Cervical intraepithelial neoplasia grade 1 07/21/2017    Allergies:  Allergies  Allergen Reactions   Milk Protein Other (See Comments)    Stomach cramps   Other Hives   Medications:  Current Outpatient Medications:    albuterol (VENTOLIN HFA) 108 (90 Base) MCG/ACT inhaler, Inhale 2 puffs into the lungs every 6 (six) hours as needed for wheezing or shortness of breath., Disp: 8 g, Rfl:  5   BIOTIN PO, Take 10,000 tablets by mouth., Disp: , Rfl:    Cholecalciferol (VITAMIN D) 50 MCG (2000 UT) tablet, Take 1 tablet (2,000 Units total) by mouth daily., Disp: , Rfl:    Spacer/Aero-Holding Chambers (AEROCHAMBER PLUS) inhaler, Use as instructed, Disp: 1 each, Rfl: 0  Observations/Objective: Patient is well-developed, well-nourished in no  acute distress.  Resting comfortably  at home.  Head is normocephalic, atraumatic.  No labored breathing.  Speech is clear and coherent with logical content.  Patient is alert and oriented at baseline.    Assessment and Plan:   1. Mild intermittent asthma with acute exacerbation  - predniSONE (DELTASONE) 20 MG tablet; Take 2 tablets (40 mg total) by mouth daily with breakfast for 3 days.  Dispense: 6 tablet; Refill: 0 - benzonatate (TESSALON) 100 MG capsule; Take 2 capsules (200 mg total) by mouth 2 (two) times daily as needed for cough.  Dispense: 20 capsule; Refill: 0 - fluticasone (FLONASE) 50 MCG/ACT nasal spray; Place 2 sprays into both nostrils daily.  Dispense: 16 g; Refill: 0 - brompheniramine-pseudoephedrine-DM 30-2-10 MG/5ML syrup; Take 5 mLs by mouth 3 (three) times daily as needed.  Dispense: 120 mL; Refill: 0   - Take meds as prescribed, most likely viral in nature - Rest voice - Use a cool mist humidifier especially during the winter months when heat dries out the air. - Use saline nose sprays frequently to help soothe nasal passages if they are drying out. - Stay hydrated by drinking plenty of fluids - Keep thermostat turn down low to prevent drying out which can cause a dry cough. - For any cough or congestion- robitussin DM or Delsym as needed - For fever or aches or pains- take tylenol or ibuprofen as directed on bottle             * for fevers greater than 101 orally you may alternate ibuprofen and tylenol every 3 hours.  If you do not improve you will need a follow up visit in person.                Reviewed side effects, risks and benefits of medication.    Patient acknowledged agreement and understanding of the plan.   Past Medical, Surgical, Social History, Allergies, and Medications have been Reviewed.   Follow Up Instructions: I discussed the assessment and treatment plan with the patient. The patient was provided an opportunity to ask questions and  all were answered. The patient agreed with the plan and demonstrated an understanding of the instructions.  A copy of instructions were sent to the patient via MyChart unless otherwise noted below.     The patient was advised to call back or seek an in-person evaluation if the symptoms worsen or if the condition fails to improve as anticipated.  Time:  I spent 10 minutes with the patient via telehealth technology discussing the above problems/concerns.    [name], NP

## 2022-11-25 ENCOUNTER — Encounter: Payer: Self-pay | Admitting: Internal Medicine

## 2022-11-25 NOTE — Progress Notes (Unsigned)
    Subjective:    Patient ID: Kelly Figueroa, female    DOB: 1981/08/11, 42 y.o.   MRN: [id number]      HPI Kelly Figueroa is here for No chief complaint on file.   Congestion, nausea, diarrhea -      Medications and allergies reviewed with patient and updated if appropriate.  Current Outpatient Medications on File Prior to Visit  Medication Sig Dispense Refill   albuterol (VENTOLIN HFA) 108 (90 Base) MCG/ACT inhaler Inhale 2 puffs into the lungs every 6 (six) hours as needed for wheezing or shortness of breath. 8 g 5   benzonatate (TESSALON) 100 MG capsule Take 2 capsules (200 mg total) by mouth 2 (two) times daily as needed for cough. 20 capsule 0   BIOTIN PO Take 10,000 tablets by mouth.     brompheniramine-pseudoephedrine-DM 30-2-10 MG/5ML syrup Take 5 mLs by mouth 3 (three) times daily as needed. 120 mL 0   Cholecalciferol (VITAMIN D) 50 MCG (2000 UT) tablet Take 1 tablet (2,000 Units total) by mouth daily.     fluticasone (FLONASE) 50 MCG/ACT nasal spray Place 2 sprays into both nostrils daily. 16 g 0   Spacer/Aero-Holding Chambers (AEROCHAMBER PLUS) inhaler Use as instructed 1 each 0   No current facility-administered medications on file prior to visit.    Review of Systems     Objective:  There were no vitals filed for this visit. BP Readings from Last 3 Encounters:  09/09/22 104/78  07/10/22 108/72  09/06/21 104/78   Wt Readings from Last 3 Encounters:  09/09/22 147 lb (66.7 kg)  07/10/22 150 lb 6.4 oz (68.2 kg)  09/06/21 146 lb (66.2 kg)   There is no height or weight on file to calculate BMI.    Physical Exam         Assessment & Plan:    See Problem List for Assessment and Plan of chronic medical problems.

## 2022-11-26 ENCOUNTER — Ambulatory Visit (INDEPENDENT_AMBULATORY_CARE_PROVIDER_SITE_OTHER): Payer: Managed Care, Other (non HMO)

## 2022-11-26 ENCOUNTER — Ambulatory Visit (INDEPENDENT_AMBULATORY_CARE_PROVIDER_SITE_OTHER): Payer: Managed Care, Other (non HMO) | Admitting: Internal Medicine

## 2022-11-26 VITALS — BP 102/78 | HR 78 | Temp 98.4°F | Ht 63.0 in | Wt 148.0 lb

## 2022-11-26 DIAGNOSIS — J4530 Mild persistent asthma, uncomplicated: Secondary | ICD-10-CM

## 2022-11-26 DIAGNOSIS — J45909 Unspecified asthma, uncomplicated: Secondary | ICD-10-CM | POA: Insufficient documentation

## 2022-11-26 DIAGNOSIS — Z113 Encounter for screening for infections with a predominantly sexual mode of transmission: Secondary | ICD-10-CM

## 2022-11-26 MED ORDER — MOMETASONE FURO-FORMOTEROL FUM 100-5 MCG/ACT IN AERO: 2.0000 | INHALATION_SPRAY | Freq: Two times a day (BID) | RESPIRATORY_TRACT | 1 refills | Status: DC

## 2022-11-26 NOTE — Assessment & Plan Note (Signed)
Asymptomatic Screening tests ordered including HIV, hepatitis C, HSV 1 and 2 and RPR She does have an appointment coming up for GYN and can discuss further with them-recommended getting retested in 3 months

## 2022-11-26 NOTE — Patient Instructions (Addendum)
      Blood work was ordered.   A chest xray was ordered.      Medications changes include :   inhaler 2 puffs twice a day      Return if symptoms worsen or fail to improve.

## 2022-11-26 NOTE — Assessment & Plan Note (Signed)
Does have some baseline exercise-induced asthma Having some shortness of breath and difficulty getting a big deep breath since having flu earlier this month Likely reactive airway disease Will get chest x-ray-lungs do sound clear, oxygen saturation is good Trial of Dulera 2 puffs twice daily-advised to rinse mouth out afterwards use albuterol as needed Most likely after a week or 2 of the maintenance inhaler she will build to stop it She will let me know if there is no improvement

## 2022-11-27 LAB — RPR: RPR Ser Ql: NONREACTIVE

## 2022-11-27 LAB — HIV ANTIBODY (ROUTINE TESTING W REFLEX): HIV 1&2 Ab, 4th Generation: NONREACTIVE

## 2022-11-27 LAB — HSV 1 ANTIBODY, IGG: HSV 1 Glycoprotein G Ab, IgG: 13.1 index — ABNORMAL HIGH

## 2022-11-27 LAB — HSV 2 ANTIBODY, IGG: HSV 2 Glycoprotein G Ab, IgG: <0.9 index

## 2022-11-27 LAB — HEPATITIS C ANTIBODY: Hepatitis C Ab: NONREACTIVE

## 2022-11-30 ENCOUNTER — Encounter: Payer: Self-pay | Admitting: Internal Medicine

## 2022-12-03 ENCOUNTER — Encounter: Payer: Self-pay | Admitting: Internal Medicine

## 2022-12-04 ENCOUNTER — Telehealth: Payer: Self-pay

## 2022-12-04 NOTE — Telephone Encounter (Signed)
The following medications may be covered If clinically appropriate, you may change the prescription. A therapeutic alternative may be available. Questions on alternatives? [place] [facility] at [phone]. You can also review [employer]'s formulary online or call them directly. Compare the original medication with possible alternatives:  MEDICATION PRIOR AUTH REQUIREMENT * Dulera 100-5MCG/ACT aerosol Not Required  Advair HFA Not Required  AirDuo Digihaler Not Required  Albuterol HFA ([employer]) Not Required  Asmanex HFA Not Required  Budesonide-Formoterol Fumarate Dihydrate Not Required  Fluticasone Furoate-Vilanterol Not Required  QVAR Redihaler Not Required  Spiriva Respimat 2.5 mcg Not Required  AirDuo RespiClick Required  Arnuity Ellipta Required  Bevespi Aerosphere Required  Pulmicort Flexhaler Required

## 2022-12-05 ENCOUNTER — Telehealth: Payer: Self-pay

## 2022-12-08 ENCOUNTER — Other Ambulatory Visit (HOSPITAL_COMMUNITY): Payer: Self-pay

## 2022-12-08 NOTE — Telephone Encounter (Signed)
My-chart message sent to patient today regarding referral.

## 2022-12-08 NOTE — Telephone Encounter (Signed)
Patient Advocate Encounter Prior authorization is required for [facility] 100-5MCG/ACT aerosol. PA submitted and APPROVED on 12/08/22.  Key [id number] Effective: 11/08/22 - 12/08/23

## 2023-02-05 ENCOUNTER — Encounter: Payer: Self-pay | Admitting: Family Medicine

## 2023-02-05 ENCOUNTER — Ambulatory Visit (INDEPENDENT_AMBULATORY_CARE_PROVIDER_SITE_OTHER): Payer: BC Managed Care – PPO | Admitting: Family Medicine

## 2023-02-05 VITALS — BP 116/78 | HR 85 | Temp 98.0°F | Ht 63.0 in | Wt 145.0 lb

## 2023-02-05 DIAGNOSIS — R52 Pain, unspecified: Secondary | ICD-10-CM

## 2023-02-05 DIAGNOSIS — J4520 Mild intermittent asthma, uncomplicated: Secondary | ICD-10-CM

## 2023-02-05 DIAGNOSIS — J3081 Allergic rhinitis due to animal (cat) (dog) hair and dander: Secondary | ICD-10-CM | POA: Insufficient documentation

## 2023-02-05 DIAGNOSIS — R051 Acute cough: Secondary | ICD-10-CM

## 2023-02-05 LAB — POCT INFLUENZA A/B
Influenza A, POC: NEGATIVE
Influenza B, POC: NEGATIVE

## 2023-02-05 LAB — POC COVID19 BINAXNOW: SARS Coronavirus 2 Ag: NEGATIVE

## 2023-02-05 MED ORDER — BUDESONIDE-FORMOTEROL FUMARATE 160-4.5 MCG/ACT IN AERO: 2.0000 | INHALATION_SPRAY | Freq: Two times a day (BID) | RESPIRATORY_TRACT | 1 refills | Status: AC

## 2023-02-05 MED ORDER — ALBUTEROL SULFATE HFA 108 (90 BASE) MCG/ACT IN AERS: 2.0000 | INHALATION_SPRAY | Freq: Four times a day (QID) | RESPIRATORY_TRACT | 1 refills | Status: AC | PRN

## 2023-02-05 NOTE — Patient Instructions (Addendum)
Your COVID and flu tests are negative today.  I suspect that you have a viral illness which will run its course over the next 5 to 7 days.  I sent in a different inhaler called Symbicort since [facility] was not covered.  Please start this as prescribed twice daily.  Rinse your mouth out after using Symbicort. Let us know if your insurance prefers a different inhaler and we can send it in.   I also refilled your albuterol inhaler to use as needed.  Over-the-counter Flonase and Mucinex would be good choices to treat cough and congestion.  Stay well-hydrated.  Please follow-up if you are getting worse or not improving in the next week.  I recommend that you follow up with your primary care provider, Dr. Lawerance Bach, in the next month for your asthma.

## 2023-02-05 NOTE — Progress Notes (Signed)
Subjective:  Kelly Figueroa is a 42 y.o. female who presents for a 2 day hx of fatigue, chills, body aches, nasal congestion, cough and wheezing. Occasional chest tightness, right ear pain, ST.   She has underlying asthma. Usually well controlled.  She has not been able to start [facility] (not affordable) and ran out of albuterol    Denies dizziness, chest pain, palpitations, shortness of breath, abdominal pain, N/V/D.    No other aggravating or relieving factors.  No other c/o.  ROS as in subjective.   Objective: Vitals:   02/05/23 0853 02/05/23 0920  BP: 116/78   Pulse: 85   Temp: 98 F (36.7 C)   SpO2: 94% 96%    General appearance: Alert, WD/WN, no distress, mildly ill appearing                             Skin: warm, no rash                           Head: no sinus tenderness                            Eyes: conjunctiva normal, corneas clear, PERRLA                            Ears: pearly TMs, external ear canals normal                          Nose: septum midline, turbinates swollen, with erythema and clear discharge             Mouth/throat: MMM, tongue normal, mild pharyngeal erythema                           Neck: supple, no adenopathy, no thyromegaly, nontender                          Heart: RRR                         Lungs: CTA bilaterally, no wheezes, rales, or rhonchi. Normal work of breathing, speaking in complete sentences without difficulty.       Assessment: Acute cough - Plan: POC COVID-19 BinaxNow, POCT Influenza A/B  Body aches - Plan: POC COVID-19 BinaxNow, POCT Influenza A/B  Mild intermittent asthma without complication - Plan: budesonide-formoterol (SYMBICORT) 160-4.5 MCG/ACT inhaler, albuterol (VENTOLIN HFA) 108 (90 Base) MCG/ACT inhaler   Plan: Negative Covid and flu tests.   No acute distress.  Most likely viral illness and she has not been treating asthma (out of albuterol) and never picked up [facility] due to not covered or affordable.  Switch to Symbicort. She will let me know. Refilled albuterol inhaler.  Suggested symptomatic OTC remedies, Flonase and Mucinex.  Nasal saline spray for congestion.  Tylenol or Ibuprofen OTC for fever and malaise.  Call/return if worsening or in 5-7 days if symptoms aren't resolving.  Recommend scheduling a f/u asthma visit with PCP

## 2023-05-26 NOTE — Progress Notes (Unsigned)
    Subjective:    Patient ID: Kelly Figueroa, female    DOB: 1981-03-12, 42 y.o.   MRN: [id number]      HPI Kelly Figueroa is here for No chief complaint on file.        Medications and allergies reviewed with patient and updated if appropriate.  Current Outpatient Medications on File Prior to Visit  Medication Sig Dispense Refill   albuterol (VENTOLIN HFA) 108 (90 Base) MCG/ACT inhaler Inhale 2 puffs into the lungs every 6 (six) hours as needed for wheezing or shortness of breath. 8 g 1   BIOTIN PO Take 10,000 tablets by mouth.     budesonide-formoterol (SYMBICORT) 160-4.5 MCG/ACT inhaler Inhale 2 puffs into the lungs 2 (two) times daily. 1 each 1   Cholecalciferol (VITAMIN D) 50 MCG (2000 UT) tablet Take 1 tablet (2,000 Units total) by mouth daily.     fluticasone (FLONASE) 50 MCG/ACT nasal spray Place 2 sprays into both nostrils daily. (Patient not taking: Reported on 02/05/2023) 16 g 0   Spacer/Aero-Holding Chambers (AEROCHAMBER PLUS) inhaler Use as instructed (Patient not taking: Reported on 02/05/2023) 1 each 0   No current facility-administered medications on file prior to visit.    Review of Systems     Objective:  There were no vitals filed for this visit. BP Readings from Last 3 Encounters:  02/05/23 116/78  11/26/22 102/78  09/09/22 104/78   Wt Readings from Last 3 Encounters:  02/05/23 145 lb (65.8 kg)  11/26/22 148 lb (67.1 kg)  09/09/22 147 lb (66.7 kg)   There is no height or weight on file to calculate BMI.    Physical Exam         Assessment & Plan:    See Problem List for Assessment and Plan of chronic medical problems.

## 2023-05-27 ENCOUNTER — Ambulatory Visit: Payer: 59 | Admitting: Internal Medicine

## 2023-05-27 VITALS — BP 112/72 | HR 63 | Temp 98.2°F | Ht 63.0 in | Wt 145.6 lb

## 2023-05-27 DIAGNOSIS — K253 Acute gastric ulcer without hemorrhage or perforation: Secondary | ICD-10-CM

## 2023-05-27 DIAGNOSIS — H02401 Unspecified ptosis of right eyelid: Secondary | ICD-10-CM | POA: Diagnosis not present

## 2023-05-27 DIAGNOSIS — K259 Gastric ulcer, unspecified as acute or chronic, without hemorrhage or perforation: Secondary | ICD-10-CM | POA: Insufficient documentation

## 2023-05-27 MED ORDER — SUCRALFATE 1 G PO TABS: 1.0000 g | ORAL_TABLET | Freq: Three times a day (TID) | ORAL | 0 refills | Status: DC

## 2023-05-27 MED ORDER — OMEPRAZOLE 40 MG PO CPDR: 40.0000 mg | DELAYED_RELEASE_CAPSULE | Freq: Every day | ORAL | 2 refills | Status: DC

## 2023-05-27 NOTE — Patient Instructions (Addendum)
      Medications changes include :   carafate 1 tab 4 times a day x 7 days.  Start omeprazole 40 mg daily - take it 30 minutes before a meal --- take daily for one month then every other day for 2 weeks and stop.     A referral was ordered for neurology and someone will call you to schedule an appointment.

## 2023-05-27 NOTE — Assessment & Plan Note (Signed)
New Weakness of right upper eyelid intermittently-only occurs when laying down and laughing Does not notice any other weakness during the day No changes in vision-recent eye exam was normal ?  Right-sided facial weakness and some pictures No sensory deficit Will refer to neurology for further evaluation

## 2023-05-27 NOTE — Assessment & Plan Note (Signed)
New History of gastric ulcer 2021 Symptoms now consistent with possible recurrence No symptoms consistent with active bleeding Start omeprazole 40 mg daily-will continue for 1 month and then take every other day for 2 weeks and then most likely can taper off of it since symptoms just started a couple of days ago Carafate 1 g 4 times daily x 1 week Since her symptoms have not been going on long I really think we can get them easily and quickly controlled If she is not seeing improvement then she will need to see GI for possible EGD

## 2023-06-09 ENCOUNTER — Encounter: Payer: Self-pay | Admitting: Neurology

## 2023-07-13 ENCOUNTER — Ambulatory Visit: Payer: 59 | Admitting: Neurology

## 2023-07-13 ENCOUNTER — Encounter: Payer: Self-pay | Admitting: Neurology

## 2023-07-13 VITALS — BP 118/76 | HR 74 | Ht 63.0 in | Wt 150.0 lb

## 2023-07-13 DIAGNOSIS — H52531 Spasm of accommodation, right eye: Secondary | ICD-10-CM

## 2023-07-13 NOTE — Patient Instructions (Addendum)
CTA head
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
## 2023-07-16 ENCOUNTER — Encounter: Payer: Self-pay | Admitting: Neurology

## 2023-07-30 ENCOUNTER — Ambulatory Visit
Admission: RE | Admit: 2023-07-30 | Discharge: 2023-07-30 | Disposition: A | Discharge status: Home / Self Care | Payer: 59 | Source: Ambulatory Visit | Attending: Neurology | Admitting: Neurology

## 2023-07-30 DIAGNOSIS — H52531 Spasm of accommodation, right eye: Secondary | ICD-10-CM

## 2023-08-10 ENCOUNTER — Telehealth: Payer: Self-pay | Admitting: Neurology

## 2023-08-10 NOTE — Telephone Encounter (Signed)
Pt would like to know results from ct scan.

## 2023-08-10 NOTE — Telephone Encounter (Signed)
Called pt and informed her that the results are not back at the time. I called reading room to send results.

## 2023-08-18 ENCOUNTER — Other Ambulatory Visit: Payer: Self-pay | Admitting: Obstetrics & Gynecology

## 2023-08-20 LAB — SURGICAL PATHOLOGY

## 2023-10-22 ENCOUNTER — Encounter: Payer: Self-pay | Admitting: Internal Medicine

## 2023-10-22 NOTE — Progress Notes (Signed)
Subjective:    Patient ID: Kelly Figueroa, female    DOB: [birth year], 43 y.o.   MRN: [id number]      HPI Bellamarie is here for  Chief Complaint  Patient presents with   Abdominal Pain    Abdominal pain; patient reports either constipation and diarrhea;  Decreased appetite; Notices discomfort a lot more    Having epigastric hollow feeling - like she has not eaten all day.  Sometimes her abdomen swells/bloats and hurts and it is bad enough she can not walk.  She denies any NSAID use.  She has at increased stress.  She is seeing a therapist.  She does have a history of gastric ulcer.  Has long history of constipation - will have that or diarrhea.  She typically can control her constipation on her own.  Recently her bowels have changed and she is not sure why.  Has taken miralax in the past, magnesium, probiotics and stool softeners w/o help.  She drinks a lot of water and tries to eat a lot of fiber.   She occasionally - maybe once a month takes dulcolax.    Medications and allergies reviewed with patient and updated if appropriate.  Current Outpatient Medications on File Prior to Visit  Medication Sig Dispense Refill   acetaminophen (TYLENOL) 500 MG tablet Take 2 tablets every 6 hours by oral route as directed.     albuterol (VENTOLIN HFA) 108 (90 Base) MCG/ACT inhaler Inhale 2 puffs into the lungs every 6 (six) hours as needed for wheezing or shortness of breath. 8 g 1   BIOTIN PO Take 10,000 tablets by mouth.     budesonide-formoterol (SYMBICORT) 160-4.5 MCG/ACT inhaler Inhale 2 puffs into the lungs 2 (two) times daily. 1 each 1   Cholecalciferol (VITAMIN D) 50 MCG (2000 UT) tablet Take 1 tablet (2,000 Units total) by mouth daily.     No current facility-administered medications on file prior to visit.    Review of Systems  Constitutional:  Positive for appetite change (decreased). Negative for chills and fever.  Gastrointestinal:  Positive for abdominal distention,  abdominal pain, constipation, diarrhea and nausea (occ). Negative for blood in stool (no melena).       Gerd occ  Genitourinary:  Negative for dysuria, frequency, hematuria and urgency.  Neurological:  Negative for light-headedness and headaches.       Objective:   Vitals:   10/23/23 0807  BP: 110/80  Pulse: 66  Temp: 98.1 F (36.7 C)  SpO2: 97%   BP Readings from Last 3 Encounters:  10/23/23 110/80  07/13/23 118/76  05/27/23 112/72   Wt Readings from Last 3 Encounters:  10/23/23 141 lb (64 kg)  07/13/23 150 lb (68 kg)  05/27/23 145 lb 9.6 oz (66 kg)   Body mass index is 24.98 kg/m.    Physical Exam Constitutional:      General: She is not in acute distress.    Appearance: Normal appearance. She is not ill-appearing.  HENT:     Head: Normocephalic and atraumatic.  Abdominal:     General: There is no distension.     Palpations: Abdomen is soft. There is no mass.     Tenderness: There is abdominal tenderness (Periumbilical-mild discomfort). There is no guarding or rebound.     Hernia: No hernia is present.  Skin:    General: Skin is warm and dry.     Findings: No rash.  Neurological:     Mental Status: She is alert.  Assessment & Plan:    See Problem List for Assessment and Plan of chronic medical problems.

## 2023-10-22 NOTE — Patient Instructions (Addendum)
       Medications changes include :   omeprazole 20 mg daily for 1 month      Return if symptoms worsen or fail to improve.

## 2023-10-23 ENCOUNTER — Ambulatory Visit (INDEPENDENT_AMBULATORY_CARE_PROVIDER_SITE_OTHER): Payer: 59 | Admitting: Internal Medicine

## 2023-10-23 VITALS — BP 110/80 | HR 66 | Temp 98.1°F | Ht 63.0 in | Wt 141.0 lb

## 2023-10-23 DIAGNOSIS — R194 Change in bowel habit: Secondary | ICD-10-CM

## 2023-10-23 DIAGNOSIS — R1033 Periumbilical pain: Secondary | ICD-10-CM | POA: Diagnosis not present

## 2023-10-23 DIAGNOSIS — R109 Unspecified abdominal pain: Secondary | ICD-10-CM | POA: Insufficient documentation

## 2023-10-23 DIAGNOSIS — F419 Anxiety disorder, unspecified: Secondary | ICD-10-CM | POA: Diagnosis not present

## 2023-10-23 MED ORDER — OMEPRAZOLE 20 MG PO CPDR: 20.0000 mg | DELAYED_RELEASE_CAPSULE | Freq: Every day | ORAL | 3 refills | Status: DC

## 2023-10-23 NOTE — Assessment & Plan Note (Signed)
Acute Having some abdominal pain, constipation alternating with diarrhea, nausea occasionally, occasional GERD, abdominal distention, decreased appetite History of chronic constipation typically controlled on her own, but has to take a laxative once a month and history of gastric ulcer No obvious cause for her symptoms, but she is having increased stress which could be contributing or the cause Start omeprazole 20 mg daily 30 minutes prior to a meal-advised to take for 1 month and then stop and just take as needed I am hoping this will help with some of the abdominal pain and distention which may help improve her bowels She will continue to work on her constipation with fiber, fluids If no improvement she will let me know

## 2023-10-23 NOTE — Assessment & Plan Note (Signed)
Chronic Has having some increased anxiety She is seeing a therapist Not currently on any medication and would like to avoid it She is exercising regularly She just started journaling Discussed other things she can try to help with her anxiety Can try over-the-counter supplements, limit her pills If no improvement we could consider a daily SSRI

## 2023-11-24 ENCOUNTER — Ambulatory Visit: Payer: 59 | Admitting: Neurology

## 2023-12-23 ENCOUNTER — Encounter: Payer: Self-pay | Admitting: Internal Medicine

## 2023-12-23 NOTE — Progress Notes (Unsigned)
    Subjective:    Patient ID: Kelly Figueroa, female    DOB: 05-05-1981, 43 y.o.   MRN: [id number]      HPI Kelly Figueroa is here for No chief complaint on file.   Missed period -   Saw her January - was having GI issues and GERD.  Started omeprazole 20 mg daily x 1 month then prn  Discomfort swallowing   Medications and allergies reviewed with patient and updated if appropriate.  Current Outpatient Medications on File Prior to Visit  Medication Sig Dispense Refill   acetaminophen (TYLENOL) 500 MG tablet Take 2 tablets every 6 hours by oral route as directed.     albuterol (VENTOLIN HFA) 108 (90 Base) MCG/ACT inhaler Inhale 2 puffs into the lungs every 6 (six) hours as needed for wheezing or shortness of breath. 8 g 1   BIOTIN PO Take 10,000 tablets by mouth.     budesonide-formoterol (SYMBICORT) 160-4.5 MCG/ACT inhaler Inhale 2 puffs into the lungs 2 (two) times daily. 1 each 1   Cholecalciferol (VITAMIN D) 50 MCG (2000 UT) tablet Take 1 tablet (2,000 Units total) by mouth daily.     omeprazole (PRILOSEC) 20 MG capsule Take 1 capsule (20 mg total) by mouth daily. Take 30 minutes prior a meal 30 capsule 3   No current facility-administered medications on file prior to visit.    Review of Systems     Objective:  There were no vitals filed for this visit. BP Readings from Last 3 Encounters:  10/23/23 110/80  07/13/23 118/76  05/27/23 112/72   Wt Readings from Last 3 Encounters:  10/23/23 141 lb (64 kg)  07/13/23 150 lb (68 kg)  05/27/23 145 lb 9.6 oz (66 kg)   There is no height or weight on file to calculate BMI.    Physical Exam         Assessment & Plan:    See Problem List for Assessment and Plan of chronic medical problems.

## 2023-12-24 ENCOUNTER — Ambulatory Visit: Admitting: Internal Medicine

## 2023-12-24 VITALS — BP 104/78 | HR 62 | Temp 97.9°F | Ht 63.0 in | Wt 146.0 lb

## 2023-12-24 DIAGNOSIS — R1310 Dysphagia, unspecified: Secondary | ICD-10-CM | POA: Insufficient documentation

## 2023-12-24 DIAGNOSIS — N912 Amenorrhea, unspecified: Secondary | ICD-10-CM

## 2023-12-24 DIAGNOSIS — K219 Gastro-esophageal reflux disease without esophagitis: Secondary | ICD-10-CM

## 2023-12-24 DIAGNOSIS — R590 Localized enlarged lymph nodes: Secondary | ICD-10-CM | POA: Diagnosis not present

## 2023-12-24 NOTE — Assessment & Plan Note (Signed)
 Chronic Intermittent Did have a flare a couple of months ago and took a omeprazole regularly and now is taking it as needed Continue omeprazole 20 mg daily as needed I do not think her current symptoms are related to her reflux

## 2023-12-24 NOTE — Patient Instructions (Addendum)
     Medications changes include :   None    A referral was ordered ENT and someone will call you to schedule an appointment.     Return if symptoms worsen or fail to improve.

## 2023-12-24 NOTE — Assessment & Plan Note (Signed)
 New Started her.  At 43 years old and has had regular periods every month-sometimes light sometimes heavy-typically lasting 5 days LMP in December and has not had a menses since Has taken several pregnancy tests and she is not pregnant Denies any hot flashes or other symptoms consistent with perimenopause Possibly perimenopausal Trying to get an appointment with her gynecologist-she has not heard from them so she will go over and speak to them directly Will hold off on checking thyroid and other blood work at this time since she would like to see her gynecologist and may need additional blood work, but she is not able to get in quickly she will let me know and I can order blood work

## 2023-12-24 NOTE — Assessment & Plan Note (Signed)
 Has had some lymph nodes in her upper neck for a while and they are tender. Was not sure if this was related to allergies or something else Is also had some dysphagia with neck extension and drinking water which is new for her Both need further evaluation Will refer to ENT Continue allergy medications

## 2023-12-24 NOTE — Assessment & Plan Note (Signed)
 New States difficulty swallowing and discomfort with swallowing water while her neck is extended. No difficulty swallowing water, food or pills when her neck is not extended Unlikely related to cervical disc disease May need laryngoscopy, CT Will refer to ENT further thoughts

## 2024-01-08 LAB — HM MAMMOGRAPHY

## 2024-01-11 ENCOUNTER — Encounter: Payer: Self-pay | Admitting: Internal Medicine

## 2024-01-11 DIAGNOSIS — R590 Localized enlarged lymph nodes: Secondary | ICD-10-CM

## 2024-01-14 ENCOUNTER — Other Ambulatory Visit: Payer: Self-pay | Admitting: Internal Medicine

## 2024-01-15 ENCOUNTER — Ambulatory Visit
Admission: RE | Admit: 2024-01-15 | Discharge: 2024-01-15 | Disposition: A | Discharge status: Home / Self Care | Source: Ambulatory Visit | Attending: Internal Medicine | Admitting: Internal Medicine

## 2024-01-15 DIAGNOSIS — R590 Localized enlarged lymph nodes: Secondary | ICD-10-CM

## 2024-01-20 ENCOUNTER — Encounter: Payer: Self-pay | Admitting: Internal Medicine

## 2024-02-05 ENCOUNTER — Ambulatory Visit (INDEPENDENT_AMBULATORY_CARE_PROVIDER_SITE_OTHER): Admitting: Otolaryngology

## 2024-02-05 VITALS — BP 121/77 | HR 66 | Ht 63.0 in | Wt 148.0 lb

## 2024-02-05 DIAGNOSIS — J3089 Other allergic rhinitis: Secondary | ICD-10-CM

## 2024-02-05 DIAGNOSIS — R590 Localized enlarged lymph nodes: Secondary | ICD-10-CM

## 2024-02-05 DIAGNOSIS — K219 Gastro-esophageal reflux disease without esophagitis: Secondary | ICD-10-CM | POA: Diagnosis not present

## 2024-02-05 DIAGNOSIS — L508 Other urticaria: Secondary | ICD-10-CM
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
## 2024-02-05 NOTE — Patient Instructions (Signed)

## 2024-02-18 NOTE — Patient Instructions (Addendum)
 Blood work was ordered.       Medications changes include :   None    A referral was ordered for GI and someone will call you to schedule an appointment.     Return in about 1 year (around 02/18/2025) for Physical Exam.   Health Maintenance, Female Adopting a healthy lifestyle and getting preventive care are important in promoting health and wellness. Ask your health care provider about: The right schedule for you to have regular tests and exams. Things you can do on your own to prevent diseases and keep yourself healthy. What should I know about diet, weight, and exercise? Eat a healthy diet  Eat a diet that includes plenty of vegetables, fruits, low-fat dairy products, and lean protein. Do not eat a lot of foods that are high in solid fats, added sugars, or sodium. Maintain a healthy weight Body mass index (BMI) is used to identify weight problems. It estimates body fat based on height and weight. Your health care provider can help determine your BMI and help you achieve or maintain a healthy weight. Get regular exercise Get regular exercise. This is one of the most important things you can do for your health. Most adults should: Exercise for at least 150 minutes each week. The exercise should increase your heart rate and make you sweat (moderate-intensity exercise). Do strengthening exercises at least twice a week. This is in addition to the moderate-intensity exercise. Spend less time sitting. Even light physical activity can be beneficial. Watch cholesterol and blood lipids Have your blood tested for lipids and cholesterol at 45 years of age, then have this test every 5 years. Have your cholesterol levels checked more often if: Your lipid or cholesterol levels are high. You are older than 45 years of age. You are at high risk for heart disease. What should I know about cancer screening? Depending on your health history and family history, you may need to have  cancer screening at various ages. This may include screening for: Breast cancer. Cervical cancer. Colorectal cancer. Skin cancer. Lung cancer. What should I know about heart disease, diabetes, and high blood pressure? Blood pressure and heart disease High blood pressure causes heart disease and increases the risk of stroke. This is more likely to develop in people who have high blood pressure readings or are overweight. Have your blood pressure checked: Every 3-5 years if you are 61-10 years of age. Every year if you are 68 years old or older. Diabetes Have regular diabetes screenings. This checks your fasting blood sugar level. Have the screening done: Once every three years after age 59 if you are at a normal weight and have a low risk for diabetes. More often and at a younger age if you are overweight or have a high risk for diabetes. What should I know about preventing infection? Hepatitis B If you have a higher risk for hepatitis B, you should be screened for this virus. Talk with your health care provider to find out if you are at risk for hepatitis B infection. Hepatitis C Testing is recommended for: Everyone born from [birth year] through 1967. Anyone with known risk factors for hepatitis C. Sexually transmitted infections (STIs) Get screened for STIs, including gonorrhea and chlamydia, if: You are sexually active and are younger than 45 years of age. You are older than 45 years of age and your health care provider tells you that you are at risk for this type of infection. Your sexual  activity has changed since you were last screened, and you are at increased risk for chlamydia or gonorrhea. Ask your health care provider if you are at risk. Ask your health care provider about whether you are at high risk for HIV. Your health care provider may recommend a prescription medicine to help prevent HIV infection. If you choose to take medicine to prevent HIV, you should first get tested for HIV.  You should then be tested every 3 months for as long as you are taking the medicine. Pregnancy If you are about to stop having your period (premenopausal) and you may become pregnant, seek counseling before you get pregnant. Take 400 to 800 micrograms (mcg) of folic acid every day if you become pregnant. Ask for birth control (contraception) if you want to prevent pregnancy. Osteoporosis and menopause Osteoporosis is a disease in which the bones lose minerals and strength with aging. This can result in bone fractures. If you are 37 years old or older, or if you are at risk for osteoporosis and fractures, ask your health care provider if you should: Be screened for bone loss. Take a calcium or vitamin D  supplement to lower your risk of fractures. Be given hormone replacement therapy (HRT) to treat symptoms of menopause. Follow these instructions at home: Alcohol use Do not drink alcohol if: Your health care provider tells you not to drink. You are pregnant, may be pregnant, or are planning to become pregnant. If you drink alcohol: Limit how much you have to: 0-1 drink a day. Know how much alcohol is in your drink. In the [place] one drink equals one 12 oz bottle of beer (355 mL), one 5 oz glass of wine (148 mL), or one 1 oz glass of hard liquor (44 mL). Lifestyle Do not use any products that contain nicotine or tobacco. These products include cigarettes, chewing tobacco, and vaping devices, such as e-cigarettes. If you need help quitting, ask your health care provider. Do not use street drugs. Do not share needles. Ask your health care provider for help if you need support or information about quitting drugs. General instructions Schedule regular health, dental, and eye exams. Stay current with your vaccines. Tell your health care provider if: You often feel depressed. You have ever been abused or do not feel safe at home. Summary Adopting a healthy lifestyle and getting preventive care  are important in promoting health and wellness. Follow your health care provider's instructions about healthy diet, exercising, and getting tested or screened for diseases. Follow your health care provider's instructions on monitoring your cholesterol and blood pressure. This information is not intended to replace advice given to you by your health care provider. Make sure you discuss any questions you have with your health care provider. Document Revised: 02/11/2021 Document Reviewed: 02/11/2021 Elsevier Patient Education  2024 [employer].

## 2024-02-18 NOTE — Progress Notes (Unsigned)
 Subjective:    Patient ID: Kelly Figueroa, female    DOB: 09-28-1981, 43 y.o.   MRN: [id number]      HPI Kelly Figueroa is here for a Physical exam and her chronic medical problems.    Gerd, abd bloating, joint swelling intermittently, rashes.  She wonders if any of this could be autoimmune driven.  Elbow pain and swelling, ankles/knees hurt and swell at times.  Takes motrin  and it helps.  Symptoms are intermittent and do not always make sense when they come.  Medications and allergies reviewed with patient and updated if appropriate.  Current Outpatient Medications on File Prior to Visit  Medication Sig Dispense Refill   acetaminophen  (TYLENOL ) 500 MG tablet Take 2 tablets every 6 hours by oral route as directed.     albuterol  (VENTOLIN  HFA) 108 (90 Base) MCG/ACT inhaler Inhale 2 puffs into the lungs every 6 (six) hours as needed for wheezing or shortness of breath. 8 g 1   ASHWAGANDHA GUMMIES PO Take by mouth.     BIOTIN PO Take 10,000 tablets by mouth.     budesonide -formoterol  (SYMBICORT ) 160-4.5 MCG/ACT inhaler Inhale 2 puffs into the lungs 2 (two) times daily. 1 each 1   Cholecalciferol (VITAMIN D ) 50 MCG (2000 UT) tablet Take 1 tablet (2,000 Units total) by mouth daily.     Cholecalciferol 1.25 MG (50000 UT) capsule TAKE 1 CAPSULE BY MOUTH ONCE A WEEK FOR VITAMIN D  DEFICIENCY     MAGNESIUM  GLUCONATE PO Take by mouth. Patient takes gummies     omeprazole  (PRILOSEC) 20 MG capsule Take 1 capsule (20 mg total) by mouth daily. Take 30 minutes prior a meal 30 capsule 3   No current facility-administered medications on file prior to visit.    Review of Systems  Constitutional:  Positive for fatigue (mild). Negative for fever.       Night sweats  HENT:  Negative for trouble swallowing.   Eyes:  Negative for visual disturbance.  Respiratory:  Negative for cough, shortness of breath and wheezing.   Cardiovascular:  Positive for palpitations (stress related). Negative for chest  pain and leg swelling.  Gastrointestinal:  Positive for abdominal distention (bloating - improves with motrin  -usually in the morning). Negative for abdominal pain, blood in stool, constipation and diarrhea.       No gerd - controlled  Genitourinary:  Negative for dysuria.  Musculoskeletal:  Positive for arthralgias and joint swelling. Negative for back pain.  Skin:  Positive for rash.  Neurological:  Positive for dizziness (occ off balance feeling), light-headedness and headaches (occ).  Psychiatric/Behavioral:  Negative for dysphoric mood. The patient is nervous/anxious.        Objective:   Vitals:   02/19/24 0858  BP: 120/80  Pulse: 80  Temp: 98.7 F (37.1 C)  SpO2: 98%   Filed Weights   02/19/24 0858  Weight: 146 lb (66.2 kg)   Body mass index is 25.86 kg/m.  BP Readings from Last 3 Encounters:  02/19/24 120/80  02/05/24 121/77  12/24/23 104/78    Wt Readings from Last 3 Encounters:  02/19/24 146 lb (66.2 kg)  02/05/24 148 lb (67.1 kg)  12/24/23 146 lb (66.2 kg)       Physical Exam Constitutional: She appears well-developed and well-nourished. No distress.  HENT:  Head: Normocephalic and atraumatic.  Right Ear: External ear normal. Normal ear canal and TM Left Ear: External ear normal.  Normal ear canal and TM Mouth/Throat: Oropharynx is clear and moist.  Eyes: Conjunctivae normal.  Neck: Neck supple. No tracheal deviation present. No thyromegaly present.  No carotid bruit  Cardiovascular: Normal rate, regular rhythm and normal heart sounds.   No murmur heard.  No edema. Pulmonary/Chest: Effort normal and breath sounds normal. No respiratory distress. She has no wheezes. She has no rales.  Breast: deferred   Abdominal: Soft. She exhibits no distension. There is no tenderness.  Lymphadenopathy: She has no cervical adenopathy.  Skin: Skin is warm and dry. She is not diaphoretic.  Psychiatric: She has a normal mood and affect. Her behavior is normal.      Lab Results  Component Value Date   WBC 6.7 09/09/2022   HGB 14.0 09/09/2022   HCT 40.9 09/09/2022   PLT 200.0 09/09/2022   GLUCOSE 97 09/09/2022   CHOL 145 09/09/2022   TRIG 73.0 09/09/2022   HDL 65.50 09/09/2022   LDLCALC 65 09/09/2022   ALT 14 09/09/2022   AST 19 09/09/2022   NA 137 09/09/2022   K 4.1 09/09/2022   CL 105 09/09/2022   CREATININE 0.76 09/09/2022   BUN 12 09/09/2022   CO2 26 09/09/2022   TSH 2.39 09/09/2022   HGBA1C 5.6 09/06/2021         Assessment & Plan:   Physical exam: Screening blood work  ordered Exercise  regular Weight  normal Substance abuse  none Drinks a good amount of water, eats healthy  Reviewed recommended immunizations.   Health Maintenance  Topic Date Due   Cervical Cancer Screening (HPV/Pap Cotest)  01/21/2021   MAMMOGRAM  09/27/2023   COVID-19 Vaccine (7 - 2024-25 season) 03/05/2024 (Originally 06/07/2023)   Pneumococcal Vaccine 25-41 Years old (1 of 2 - PCV) 02/18/2025 (Originally 02/26/1998)   INFLUENZA VACCINE  05/06/2024   DTaP/Tdap/Td (2 - Td or Tdap) 08/23/2028   Hepatitis C Screening  Completed   HIV Screening  Completed   HPV VACCINES  Aged Out   Meningococcal B Vaccine  Aged Out          See Problem List for Assessment and Plan of chronic medical problems.

## 2024-02-19 ENCOUNTER — Ambulatory Visit (INDEPENDENT_AMBULATORY_CARE_PROVIDER_SITE_OTHER): Admitting: Internal Medicine

## 2024-02-19 ENCOUNTER — Encounter: Payer: Self-pay | Admitting: Internal Medicine

## 2024-02-19 VITALS — BP 120/80 | HR 80 | Temp 98.7°F | Ht 63.0 in | Wt 146.0 lb

## 2024-02-19 DIAGNOSIS — Z1211 Encounter for screening for malignant neoplasm of colon: Secondary | ICD-10-CM

## 2024-02-19 DIAGNOSIS — R5383 Other fatigue: Secondary | ICD-10-CM | POA: Insufficient documentation

## 2024-02-19 DIAGNOSIS — Z0000 Encounter for general adult medical examination without abnormal findings: Secondary | ICD-10-CM

## 2024-02-19 DIAGNOSIS — M2550 Pain in unspecified joint: Secondary | ICD-10-CM | POA: Diagnosis not present

## 2024-02-19 DIAGNOSIS — K219 Gastro-esophageal reflux disease without esophagitis: Secondary | ICD-10-CM | POA: Diagnosis not present

## 2024-02-19 DIAGNOSIS — R739 Hyperglycemia, unspecified: Secondary | ICD-10-CM | POA: Diagnosis not present

## 2024-02-19 DIAGNOSIS — R140 Abdominal distension (gaseous): Secondary | ICD-10-CM

## 2024-02-19 DIAGNOSIS — E559 Vitamin D deficiency, unspecified: Secondary | ICD-10-CM | POA: Diagnosis not present

## 2024-02-19 DIAGNOSIS — E538 Deficiency of other specified B group vitamins: Secondary | ICD-10-CM | POA: Diagnosis not present

## 2024-02-19 LAB — LIPID PANEL
Cholesterol: 157 mg/dL (ref 0–200)
HDL: 64.7 mg/dL (ref >39.00)
LDL Cholesterol: 79 mg/dL (ref 0–99)
NonHDL: 92.58
Total CHOL/HDL Ratio: 2
Triglycerides: 69 mg/dL (ref 0.0–149.0)
VLDL: 13.8 mg/dL (ref 0.0–40.0)

## 2024-02-19 LAB — CBC WITH DIFFERENTIAL/PLATELET
Basophils Absolute: 0 10*3/uL (ref 0.0–0.1)
Basophils Relative: 0.4 % (ref 0.0–3.0)
Eosinophils Absolute: 0.1 10*3/uL (ref 0.0–0.7)
Eosinophils Relative: 2.3 % (ref 0.0–5.0)
HCT: 38.8 % (ref 36.0–46.0)
Hemoglobin: 12.9 g/dL (ref 12.0–15.0)
Lymphocytes Relative: 31.9 % (ref 12.0–46.0)
Lymphs Abs: 1.7 10*3/uL (ref 0.7–4.0)
MCHC: 33.2 g/dL (ref 30.0–36.0)
MCV: 90.2 fl (ref 78.0–100.0)
Monocytes Absolute: 0.5 10*3/uL (ref 0.1–1.0)
Monocytes Relative: 10 % (ref 3.0–12.0)
Neutro Abs: 2.9 10*3/uL (ref 1.4–7.7)
Neutrophils Relative %: 55.4 % (ref 43.0–77.0)
Platelets: 200 10*3/uL (ref 150.0–400.0)
RBC: 4.3 Mil/uL (ref 3.87–5.11)
RDW: 12.7 % (ref 11.5–15.5)
WBC: 5.2 10*3/uL (ref 4.0–10.5)

## 2024-02-19 LAB — COMPREHENSIVE METABOLIC PANEL WITH GFR
ALT: 9 U/L (ref 0–35)
AST: 17 U/L (ref 0–37)
Albumin: 4.5 g/dL (ref 3.5–5.2)
Alkaline Phosphatase: 66 U/L (ref 39–117)
BUN: 14 mg/dL (ref 6–23)
CO2: 27 meq/L (ref 19–32)
Calcium: 9.1 mg/dL (ref 8.4–10.5)
Chloride: 105 meq/L (ref 96–112)
Creatinine, Ser: 0.77 mg/dL (ref 0.40–1.20)
GFR: 93.45 mL/min
Glucose, Bld: 84 mg/dL (ref 70–99)
Potassium: 3.9 meq/L (ref 3.5–5.1)
Sodium: 139 meq/L (ref 135–145)
Total Bilirubin: 0.4 mg/dL (ref 0.2–1.2)
Total Protein: 7.1 g/dL (ref 6.0–8.3)

## 2024-02-19 LAB — HEMOGLOBIN A1C: Hgb A1c MFr Bld: 5.7 % (ref 4.6–6.5)

## 2024-02-19 LAB — SEDIMENTATION RATE: Sed Rate: 8 mm/h (ref 0–20)

## 2024-02-19 LAB — C-REACTIVE PROTEIN: CRP: <1 mg/dL (ref 0.5–20.0)

## 2024-02-19 NOTE — Assessment & Plan Note (Signed)
 Kelly Figueroa Has been experiencing abdominal bloating Denies GERD and feels her bowels are normal Not related to any specific foods-does eat a lot of vegetables Could try probiotics to see if that helps If there is any relation to bowel movements Due for colonoscopy and has a history of GERD although it is controlled-referral to GI-?  Need EGD as well as colonoscopy

## 2024-02-19 NOTE — Assessment & Plan Note (Signed)
 Chronic Energy level seems lower than it should be Will check CBC, CMP, TSH He is exercising regularly Sleep is good

## 2024-02-19 NOTE — Assessment & Plan Note (Signed)
 Chronic Lab Results  Component Value Date   HGBA1C 5.6 09/06/2021   Check a1c Low sugar / carb diet Stressed regular exercise

## 2024-02-19 NOTE — Assessment & Plan Note (Signed)
 Chronic Controlled Has had a couple of flares in the past and I think she may have had gastritis earlier this year Continue omeprazole  20 mg daily

## 2024-02-19 NOTE — Assessment & Plan Note (Signed)
Chronic Will check B12 level

## 2024-02-19 NOTE — Assessment & Plan Note (Signed)
 New States bilateral joint pain-elbows, feet, knees-pain and swelling Not necessarily related to activity Does have a family history of autoimmune disease Check autoimmune blood work including ANA, ESR, CRP, CCP and RF

## 2024-02-19 NOTE — Assessment & Plan Note (Signed)
 Chronic Taking vitamin D daily Check vitamin D level

## 2024-02-22 LAB — ANTI-NUCLEAR AB-TITER (ANA TITER): ANA Titer 1: 1:40 {titer} — ABNORMAL HIGH

## 2024-02-22 LAB — ANA: Anti Nuclear Antibody (ANA): POSITIVE — AB

## 2024-02-22 LAB — CYCLIC CITRUL PEPTIDE ANTIBODY, IGG: Cyclic Citrullin Peptide Ab: <16 U

## 2024-02-22 LAB — RHEUMATOID FACTOR: Rheumatoid fact SerPl-aCnc: <10 [IU]/mL (ref <14)

## 2024-02-23 ENCOUNTER — Ambulatory Visit: Payer: Self-pay | Admitting: Internal Medicine

## 2024-02-23 LAB — VITAMIN D 25 HYDROXY (VIT D DEFICIENCY, FRACTURES): VITD: 60.03 ng/mL (ref 30.00–100.00)

## 2024-02-23 LAB — TSH: TSH: 4.35 u[IU]/mL (ref 0.35–5.50)

## 2024-02-23 LAB — VITAMIN B12: Vitamin B-12: 644 pg/mL (ref 211–911)
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
## 2024-04-22 ENCOUNTER — Encounter: Payer: Self-pay | Admitting: Physician Assistant

## 2024-04-22 ENCOUNTER — Ambulatory Visit: Admitting: Physician Assistant

## 2024-04-22 VITALS — BP 108/68 | HR 68 | Ht 62.0 in | Wt 151.4 lb

## 2024-04-22 DIAGNOSIS — R1084 Generalized abdominal pain: Secondary | ICD-10-CM | POA: Diagnosis not present

## 2024-04-22 DIAGNOSIS — R109 Unspecified abdominal pain: Secondary | ICD-10-CM

## 2024-04-22 DIAGNOSIS — K5904 Chronic idiopathic constipation: Secondary | ICD-10-CM

## 2024-04-22 DIAGNOSIS — R140 Abdominal distension (gaseous): Secondary | ICD-10-CM

## 2024-04-22 DIAGNOSIS — K259 Gastric ulcer, unspecified as acute or chronic, without hemorrhage or perforation: Secondary | ICD-10-CM

## 2024-04-22 DIAGNOSIS — R1031 Right lower quadrant pain: Secondary | ICD-10-CM

## 2024-04-22 DIAGNOSIS — Z129 Encounter for screening for malignant neoplasm, site unspecified: Secondary | ICD-10-CM

## 2024-04-22 DIAGNOSIS — R1032 Left lower quadrant pain: Secondary | ICD-10-CM

## 2024-04-22 MED ORDER — NA SULFATE-K SULFATE-MG SULF 17.5-3.13-1.6 GM/177ML PO SOLN: 1.0000 | Freq: Once | ORAL | 0 refills | Status: AC

## 2024-04-22 NOTE — Patient Instructions (Signed)
 We have given you samples of the following medication to take: Linzess  72 mcg, Linzess  145 mcg and Linzess  290 mcg  You have been scheduled for a CT scan of the abdomen and pelvis at [facility], 1st floor Radiology. You are scheduled on 04/29/24 at 2:30 pm. You should arrive 15 minutes prior to your appointment time for registration.    If you have any questions regarding your exam or if you need to reschedule, you may call [facility] at [phone] between the hours of 8:00 am and 5:00 pm, Monday-Friday.   You have been scheduled for an Endoscopy and Colonoscopy. Please follow the written instructions given to you at your visit today.  If you use inhalers (even only as needed), please bring them with you on the day of your procedure.  DO NOT TAKE 7 DAYS PRIOR TO TEST- Trulicity (dulaglutide) Ozempic, Wegovy (semaglutide) Mounjaro (tirzepatide) Bydureon Bcise (exanatide extended release)  DO NOT TAKE 1 DAY PRIOR TO YOUR TEST Rybelsus (semaglutide) Adlyxin (lixisenatide) Victoza (liraglutide) Byetta (exanatide) ___________________________________________________________________________  Please follow up sooner if symptoms increase or worsen __________________________________________________________________________  Due to recent changes in healthcare laws, you may see the results of your imaging and laboratory studies on MyChart before your provider has had a chance to review them.  We understand that in some cases there may be results that are confusing or concerning to you. Not all laboratory results come back in the same time frame and the provider may be waiting for multiple results in order to interpret others.  Please give us  48 hours in order for your provider to thoroughly review all the results before contacting the office for clarification of your results.   Thank you for trusting me with your gastrointestinal care!   Ellouise Console,  PA-C _______________________________________________________  If your blood pressure at your visit was 140/90 or greater, please contact your primary care physician to follow up on this.  _______________________________________________________  If you are age 45 or older, your body mass index should be between 23-30. Your Body mass index is 27.69 kg/m. If this is out of the aforementioned range listed, please consider follow up with your Primary Care Provider.  If you are age 17 or younger, your body mass index should be between 19-25. Your Body mass index is 27.69 kg/m. If this is out of the aformentioned range listed, please consider follow up with your Primary Care Provider.   ________________________________________________________  The [facility] providers would like to encourage you to use MYCHART to communicate with providers for non-urgent requests or questions.  Due to long hold times on the telephone, sending your provider a message by [facility] may be a faster and more efficient way to get a response.  Please allow 48 business hours for a response.  Please remember that this is for non-urgent requests.  _______________________________________________________

## 2024-04-23 NOTE — Progress Notes (Signed)
 Agree with assessment and plan as outlined.

## 2024-04-29 ENCOUNTER — Ambulatory Visit (HOSPITAL_COMMUNITY)
Admission: RE | Admit: 2024-04-29 | Discharge: 2024-04-29 | Disposition: A | Discharge status: Home / Self Care | Source: Ambulatory Visit | Attending: Physician Assistant | Admitting: Physician Assistant

## 2024-04-29 DIAGNOSIS — R1032 Left lower quadrant pain: Secondary | ICD-10-CM | POA: Diagnosis present

## 2024-04-29 DIAGNOSIS — R1031 Right lower quadrant pain: Secondary | ICD-10-CM | POA: Diagnosis present

## 2024-04-29 DIAGNOSIS — R1084 Generalized abdominal pain: Secondary | ICD-10-CM | POA: Insufficient documentation

## 2024-04-29 MED ORDER — IOHEXOL 300 MG/ML  SOLN
100.0000 mL | Freq: Once | INTRAMUSCULAR | Status: AC | PRN
  Administered 2024-04-29: 100 mL via INTRAVENOUS

## 2024-05-02 ENCOUNTER — Ambulatory Visit: Payer: Self-pay | Admitting: Physician Assistant

## 2024-06-03 ENCOUNTER — Encounter: Payer: Self-pay | Admitting: Gastroenterology

## 2024-06-06 ENCOUNTER — Encounter: Payer: Self-pay | Admitting: Gastroenterology

## 2024-06-06 ENCOUNTER — Other Ambulatory Visit: Payer: Self-pay | Admitting: Medical Genetics

## 2024-06-10 ENCOUNTER — Encounter: Payer: Self-pay | Admitting: Gastroenterology

## 2024-06-10 ENCOUNTER — Ambulatory Visit: Admitting: Gastroenterology

## 2024-06-10 VITALS — BP 127/68 | HR 92 | Temp 98.1°F | Resp 15

## 2024-06-10 DIAGNOSIS — Z1211 Encounter for screening for malignant neoplasm of colon: Secondary | ICD-10-CM | POA: Diagnosis present

## 2024-06-10 DIAGNOSIS — D127 Benign neoplasm of rectosigmoid junction: Secondary | ICD-10-CM

## 2024-06-10 DIAGNOSIS — K2950 Unspecified chronic gastritis without bleeding: Secondary | ICD-10-CM

## 2024-06-10 DIAGNOSIS — K635 Polyp of colon: Secondary | ICD-10-CM

## 2024-06-10 DIAGNOSIS — K648 Other hemorrhoids: Secondary | ICD-10-CM | POA: Diagnosis not present

## 2024-06-10 DIAGNOSIS — Z8711 Personal history of peptic ulcer disease: Secondary | ICD-10-CM

## 2024-06-10 DIAGNOSIS — R1084 Generalized abdominal pain: Secondary | ICD-10-CM

## 2024-06-10 DIAGNOSIS — K449 Diaphragmatic hernia without obstruction or gangrene: Secondary | ICD-10-CM | POA: Diagnosis not present

## 2024-06-10 DIAGNOSIS — D125 Benign neoplasm of sigmoid colon: Secondary | ICD-10-CM

## 2024-06-10 MED ORDER — SODIUM CHLORIDE 0.9 % IV SOLN: 500.0000 mL | Freq: Once | INTRAVENOUS | Status: DC
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
## 2024-06-10 NOTE — Progress Notes (Signed)
 Sedate, gd SR, tolerated procedure well, VSS, report to RN

## 2024-06-10 NOTE — Progress Notes (Signed)
 Pt's states no medical or surgical changes since previsit or office visit.

## 2024-06-10 NOTE — Op Note (Signed)
 [facility] Patient Name: Kelly Figueroa Procedure Date: 06/10/2024 9:16 AM MRN: [id number] Endoscopist: Elspeth P. Leigh , MD, [id number] Age: 43 Referring MD:  Date of Birth: Aug 15, 1981 Gender: Female Account #: [id number] Procedure:                Colonoscopy Indications:              Screening for colorectal malignant neoplasm, This                            is the patient's first colonoscopy Medicines:                Monitored Anesthesia Care Procedure:                Pre-Anesthesia Assessment:                           - Prior to the procedure, a History and Physical                            was performed, and patient medications and                            allergies were reviewed. The patient's tolerance of                            previous anesthesia was also reviewed. The risks                            and benefits of the procedure and the sedation                            options and risks were discussed with the patient.                            All questions were answered, and informed consent                            was obtained. Prior Anticoagulants: The patient has                            taken no anticoagulant or antiplatelet agents. ASA                            Grade Assessment: II - A patient with mild systemic                            disease. After reviewing the risks and benefits,                            the patient was deemed in satisfactory condition to                            undergo the procedure.  After obtaining informed consent, the colonoscope                            was passed under direct vision. Throughout the                            procedure, the patient's blood pressure, pulse, and                            oxygen saturations were monitored continuously. The                            [id number]-[id number] Colonoscope [id number] was introduced                            through the anus  and advanced to the the terminal                            ileum, with identification of the appendiceal                            orifice and IC valve. The colonoscopy was performed                            without difficulty. The patient tolerated the                            procedure well. The quality of the bowel                            preparation was good. The terminal ileum, ileocecal                            valve, appendiceal orifice, and rectum were                            photographed. Scope In: 9:27:29 AM Scope Out: 9:48:54 AM Scope Withdrawal Time: 0 hours 17 minutes 9 seconds  Total Procedure Duration: 0 hours 21 minutes 25 seconds  Findings:                 The perianal and digital rectal examinations were                            normal.                           The terminal ileum appeared normal.                           A 4 to 5 mm polyp was found in the sigmoid colon.                            The polyp was sessile. The polyp was removed with a  cold snare. Resection and retrieval were complete.                           A 4 mm polyp was found in the recto-sigmoid colon.                            The polyp was sessile. The polyp was removed with a                            cold snare. Resection and retrieval were complete.                           Internal hemorrhoids were found during                            retroflexion. The hemorrhoids were small.                           The exam was otherwise without abnormality. Complications:            No immediate complications. Estimated blood loss:                            Minimal. Estimated Blood Loss:     Estimated blood loss was minimal. Impression:               - The examined portion of the ileum was normal.                           - One 4 to 5 mm polyp in the sigmoid colon, removed                            with a cold snare. Resected and retrieved.                            - One 4 mm polyp at the recto-sigmoid colon,                            removed with a cold snare. Resected and retrieved.                           - Internal hemorrhoids.                           - The examination was otherwise normal. Recommendation:           - Patient has a contact number available for                            emergencies. The signs and symptoms of potential                            delayed complications were discussed with the  patient. Return to normal activities tomorrow.                            Written discharge instructions were provided to the                            patient.                           - Resume previous diet.                           - Continue present medications.                           - Await pathology results. Elspeth P. Navy Belay, MD 06/10/2024 9:54:26 AM This report has been signed electronically.

## 2024-06-10 NOTE — Op Note (Signed)
 [facility] Patient Name: Kelly Figueroa Procedure Date: 06/10/2024 9:17 AM MRN: [id number] Endoscopist: Elspeth P. Leigh , MD, [id number] Age: 43 Referring MD:  Date of Birth: 1980/11/12 Gender: Female Account #: [id number] Procedure:                Upper GI endoscopy Indications:              intermittent abdominal pain, follow-up of gastric                            ulcer noted in 12/2019 - ensure mucosal healing. On                            omeprazole  20mg  / day Medicines:                Monitored Anesthesia Care Procedure:                Pre-Anesthesia Assessment:                           - Prior to the procedure, a History and Physical                            was performed, and patient medications and                            allergies were reviewed. The patient's tolerance of                            previous anesthesia was also reviewed. The risks                            and benefits of the procedure and the sedation                            options and risks were discussed with the patient.                            All questions were answered, and informed consent                            was obtained. Prior Anticoagulants: The patient has                            taken no anticoagulant or antiplatelet agents. ASA                            Grade Assessment: I - A normal, healthy patient.                            After reviewing the risks and benefits, the patient                            was deemed in satisfactory condition to undergo the  procedure.                           After obtaining informed consent, the endoscope was                            passed under direct vision. Throughout the                            procedure, the patient's blood pressure, pulse, and                            oxygen saturations were monitored continuously. The                            GIF [id number] #[id number] was  introduced through the                            mouth, and advanced to the second part of duodenum.                            The upper GI endoscopy was accomplished without                            difficulty. The patient tolerated the procedure                            well. Scope In: Scope Out: Findings:                 Esophagogastric landmarks were identified: the                            Z-line was found at 35 cm, the gastroesophageal                            junction was found at 35 cm and the upper extent of                            the gastric folds was found at 36 cm from the                            incisors.                           A 1 cm hiatal hernia was present.                           The exam of the esophagus was otherwise normal.                           The entire examined stomach was normal. Previously                            noted ulcer has since healed. Biopsies were taken  with a cold forceps for Helicobacter pylori testing.                           The examined duodenum was normal. Complications:            No immediate complications. Estimated blood loss:                            Minimal. Estimated Blood Loss:     Estimated blood loss was minimal. Impression:               - Esophagogastric landmarks identified.                           - 1 cm hiatal hernia.                           - Normal esophagus.                           - Normal stomach. Biopsied.                           - Normal examined duodenum.                           No overt cause of symptoms on this exam, interval                            healing of gastric ulcer. Biopsies taken to rule                            out H pylori. Symptoms could be due in part to                            constipation / bloating. Recommendation:           - Patient has a contact number available for                            emergencies. The signs and  symptoms of potential                            delayed complications were discussed with the                            patient. Return to normal activities tomorrow.                            Written discharge instructions were provided to the                            patient.                           - Resume previous diet.                           -  Continue present medications.                           - Await pathology results.                           - Trial of IB gard to use PRN for pain / bloating,                            can get free samples from the office Mirelle Biskup P. Annalisse Minkoff, MD 06/10/2024 9:57:48 AM This report has been signed electronically.

## 2024-06-10 NOTE — Patient Instructions (Addendum)
 -Handout on polyps, hemorrhoid and hiatal hernia provided. -await pathology results. -repeat colonoscopy for surveillance recommended. Date to be determined when pathology result become available.  -Continue present medications. Trial  of IB gard to use as needed for bloating   YOU HAD AN ENDOSCOPIC PROCEDURE TODAY AT THE [facility]:   Refer to the procedure report that was given to you for any specific questions about what was found during the examination.  If the procedure report does not answer your questions, please call your gastroenterologist to clarify.  If you requested that your care partner not be given the details of your procedure findings, then the procedure report has been included in a sealed envelope for you to review at your convenience later.  YOU SHOULD EXPECT: Some feelings of bloating in the abdomen. Passage of more gas than usual.  Walking can help get rid of the air that was put into your GI tract during the procedure and reduce the bloating. If you had a lower endoscopy (such as a colonoscopy or flexible sigmoidoscopy) you may notice spotting of blood in your stool or on the toilet paper. If you underwent a bowel prep for your procedure, you may not have a normal bowel movement for a few days.  Please Note:  You might notice some irritation and congestion in your nose or some drainage.  This is from the oxygen used during your procedure.  There is no need for concern and it should clear up in a day or so.  SYMPTOMS TO REPORT IMMEDIATELY:  Following lower endoscopy (colonoscopy or flexible sigmoidoscopy):  Excessive amounts of blood in the stool  Significant tenderness or worsening of abdominal pains  Swelling of the abdomen that is new, acute  Fever of 100F or higher  Following upper endoscopy (EGD)  Vomiting of blood or coffee ground material  New chest pain or pain under the shoulder blades  Painful or persistently difficult swallowing  New shortness  of breath  Fever of 100F or higher  Black, tarry-looking stools  For urgent or emergent issues, a gastroenterologist can be reached at any hour by calling [phone]. Do not use MyChart messaging for urgent concerns.    DIET:  We do recommend a small meal at first, but then you may proceed to your regular diet.  Drink plenty of fluids but you should avoid alcoholic beverages for 24 hours.  ACTIVITY:  You should plan to take it easy for the rest of today and you should NOT DRIVE or use heavy machinery until tomorrow (because of the sedation medicines used during the test).    FOLLOW UP: Our staff will call the number listed on your records the next business day following your procedure.  We will call around 7:15- 8:00 am to check on you and address any questions or concerns that you may have regarding the information given to you following your procedure. If we do not reach you, we will leave a message.     If any biopsies were taken you will be contacted by phone or by letter within the next 1-3 weeks.  Please call us  at [phone] if you have not heard about the biopsies in 3 weeks.    SIGNATURES/CONFIDENTIALITY: You and/or your care partner have signed paperwork which will be entered into your electronic medical record.  These signatures attest to the fact that that the information above on your After Visit Summary has been reviewed and is understood.  Full responsibility of the  confidentiality of this discharge information lies with you and/or your care-partner.  Hiatal Hernia  A hiatal hernia occurs when part of the stomach slides above the muscle that separates the abdomen from the chest (diaphragm). A person can be born with a hiatal hernia (congenital), or it may develop over time. In almost all cases of hiatal hernia, only the top part of the stomach pushes through the diaphragm. Many people have a hiatal hernia with no symptoms. The larger the hernia, the more likely it is  that you will have symptoms. In some cases, a hiatal hernia allows stomach acid to flow back into the tube that carries food from your mouth to your stomach (esophagus). This may cause heartburn symptoms. The development of heartburn symptoms may mean that you have a condition called gastroesophageal reflux disease (GERD). What are the causes? This condition is caused by a weakness in the opening (hiatus) where the esophagus passes through the diaphragm to attach to the upper part of the stomach. A person may be born with a weakness in the hiatus, or a weakness can develop over time. What increases the risk? This condition is more likely to develop in: Older people. Age is a major risk factor for a hiatal hernia, especially if you are over the age of 64. Pregnant women. People who are overweight. People who have frequent constipation. What are the signs or symptoms? Symptoms of this condition usually develop in the form of GERD symptoms. Symptoms include: Heartburn. Upset stomach (indigestion). Trouble swallowing. Coughing or wheezing. Wheezing is making high-pitched whistling sounds when you breathe. Sore throat. Chest pain. Nausea and vomiting. How is this diagnosed? This condition may be diagnosed during testing for GERD. Tests that may be done include: X-rays of your stomach or chest. An upper gastrointestinal (GI) series. This is an X-ray exam of your GI tract that is taken after you swallow a chalky liquid that shows up clearly on the X-ray. Endoscopy. This is a procedure to look into your stomach using a thin, flexible tube that has a tiny camera and light on the end of it. How is this treated? This condition may be treated by: Dietary and lifestyle changes to help reduce GERD symptoms. Medicines. These may include: Over-the-counter antacids. Medicines that make your stomach empty more quickly. Medicines that block the production of stomach acid (H2 blockers). Stronger medicines  to reduce stomach acid (proton pump inhibitors). Surgery to repair the hernia, if other treatments are not helping. If you have no symptoms, you may not need treatment. Follow these instructions at home: Lifestyle and activity Do not use any products that contain nicotine or tobacco. These products include cigarettes, chewing tobacco, and vaping devices, such as e-cigarettes. If you need help quitting, ask your health care provider. Try to achieve and maintain a healthy body weight. Avoid putting pressure on your abdomen. Anything that puts pressure on your abdomen increases the amount of acid that may be pushed up into your esophagus. Avoid bending over, especially after eating. Raise the head of your bed by putting blocks under the legs. This keeps your head and esophagus higher than your stomach. Do not wear tight clothing around your chest or stomach. Try not to strain when having a bowel movement, when urinating, or when lifting heavy objects. Eating and drinking Avoid foods that can worsen GERD symptoms. These may include: Fatty foods, like fried foods. Citrus fruits, like oranges or lemon. Other foods and drinks that contain acid, like orange juice or tomatoes.  Spicy food. Chocolate. Eat frequent small meals instead of three large meals a day. This helps prevent your stomach from getting too full. Eat slowly. Do not lie down right after eating. Do not eat 1-2 hours before bed. Do not drink beverages with caffeine. These include cola, coffee, cocoa, and tea. Do not drink alcohol. General instructions Take over-the-counter and prescription medicines only as told by your health care provider. Keep all follow-up visits. Your health care provider will want to check that any new prescribed medicines are helping your symptoms. Contact a health care provider if: Your symptoms are not controlled with medicines or lifestyle changes. You are having trouble swallowing. You have coughing or  wheezing that will not go away. Your pain is getting worse. Your pain spreads to your arms, neck, jaw, teeth, or back. You feel nauseous or you vomit. Get help right away if: You have shortness of breath. You vomit blood. You have bright red blood in your stools. You have black, tarry stools. These symptoms may be an emergency. Get help right away. Call 911. Do not wait to see if the symptoms will go away. Do not drive yourself to the hospital. Summary A hiatal hernia occurs when part of the stomach slides above the muscle that separates the abdomen from the chest. A person may be born with a weakness in the hiatus, or a weakness can develop over time. Symptoms of a hiatal hernia may include heartburn, trouble swallowing, or sore throat. Management of a hiatal hernia includes eating frequent small meals instead of three large meals a day. Get help right away if you vomit blood, have bright red blood in your stools, or have black, tarry stools. This information is not intended to replace advice given to you by your health care provider. Make sure you discuss any questions you have with your health care provider. Document Revised: 11/19/2021 Document Reviewed: 11/19/2021 Elsevier Patient Education  2024 [employer].

## 2024-06-10 NOTE — Progress Notes (Signed)
 Called to room to assist during endoscopic procedure.  Patient ID and intended procedure confirmed with present staff. Received instructions for my participation in the procedure from the performing physician.

## 2024-06-13 ENCOUNTER — Telehealth: Payer: Self-pay

## 2024-06-13 NOTE — Telephone Encounter (Signed)
  Follow up Call-     06/10/2024    8:07 AM  Call back number  Post procedure Call Back phone  # [phone]  Permission to leave phone message Yes     Patient questions:  Do you have a fever, pain , or abdominal swelling? No. Pain Score  0 *  Have you tolerated food without any problems? Yes.    Have you been able to return to your normal activities? Yes.    Do you have any questions about your discharge instructions: Diet   No. Medications  No. Follow up visit  No.  Do you have questions or concerns about your Care? No.  Actions: * If pain score is 4 or above: No action needed, pain <4.

## 2024-06-14 ENCOUNTER — Other Ambulatory Visit (HOSPITAL_COMMUNITY)
Admission: RE | Admit: 2024-06-14 | Discharge: 2024-06-14 | Disposition: A | Discharge status: Home / Self Care | Payer: Self-pay | Source: Ambulatory Visit | Attending: Medical Genetics | Admitting: Medical Genetics

## 2024-06-14 LAB — SURGICAL PATHOLOGY

## 2024-06-15 ENCOUNTER — Ambulatory Visit: Payer: Self-pay | Admitting: Gastroenterology

## 2024-06-21 LAB — GENECONNECT MOLECULAR SCREEN: Genetic Analysis Overall Interpretation: NEGATIVE

## 2024-06-30 ENCOUNTER — Ambulatory Visit: Admitting: Physician Assistant

## 2024-08-26 ENCOUNTER — Ambulatory Visit: Admitting: Gastroenterology

## 2024-08-26 ENCOUNTER — Ambulatory Visit: Admitting: Physician Assistant

## 2024-09-13 ENCOUNTER — Ambulatory Visit: Admitting: Internal Medicine

## 2024-09-19 ENCOUNTER — Encounter: Payer: Self-pay | Admitting: Internal Medicine

## 2024-09-19 NOTE — Progress Notes (Unsigned)
° ° °  Subjective:    Patient ID: Kelly Figueroa, female    DOB: 1981/10/02, 43 y.o.   MRN: [id number]      HPI Kelly Figueroa is here for No chief complaint on file.   Breast pain -      Medications and allergies reviewed with patient and updated if appropriate.  Medications Ordered Prior to Encounter[1]  Review of Systems     Objective:  There were no vitals filed for this visit. BP Readings from Last 3 Encounters:  06/10/24 127/68  04/22/24 108/68  02/19/24 120/80   Wt Readings from Last 3 Encounters:  04/22/24 151 lb 6 oz (68.7 kg)  02/19/24 146 lb (66.2 kg)  02/05/24 148 lb (67.1 kg)   There is no height or weight on file to calculate BMI.    Physical Exam         Assessment & Plan:    See Problem List for Assessment and Plan of chronic medical problems.         [1]  Current Outpatient Medications on File Prior to Visit  Medication Sig Dispense Refill   acetaminophen  (TYLENOL ) 500 MG tablet Take 2 tablets every 6 hours by oral route as directed.     albuterol  (VENTOLIN  HFA) 108 (90 Base) MCG/ACT inhaler Inhale 2 puffs into the lungs every 6 (six) hours as needed for wheezing or shortness of breath. 8 g 1   ASHWAGANDHA GUMMIES PO Take by mouth.     BIOTIN PO Take 10,000 tablets by mouth.     budesonide -formoterol  (SYMBICORT ) 160-4.5 MCG/ACT inhaler Inhale 2 puffs into the lungs 2 (two) times daily. 1 each 1   Cholecalciferol (VITAMIN D ) 50 MCG (2000 UT) tablet Take 1 tablet (2,000 Units total) by mouth daily.     Cyanocobalamin  (VITAMIN B12) 1000 MCG TBCR Take 1 tablet by mouth daily.     MAGNESIUM  GLUCONATE PO Take by mouth. Patient takes gummies     No current facility-administered medications on file prior to visit.

## 2024-09-20 ENCOUNTER — Ambulatory Visit: Admitting: Internal Medicine

## 2024-09-20 VITALS — BP 118/64 | HR 56 | Temp 98.0°F | Ht 62.0 in | Wt 151.0 lb

## 2024-09-20 DIAGNOSIS — N644 Mastodynia: Secondary | ICD-10-CM

## 2024-09-20 NOTE — Patient Instructions (Signed)
 Diagnostic mammogram and ultrasound ordered

## 2024-09-22 ENCOUNTER — Ambulatory Visit
Admission: RE | Admit: 2024-09-22 | Discharge: 2024-09-22 | Disposition: A | Source: Ambulatory Visit | Attending: Internal Medicine

## 2024-09-22 DIAGNOSIS — N644 Mastodynia: Secondary | ICD-10-CM

## 2024-10-10 ENCOUNTER — Encounter: Payer: Self-pay | Admitting: Internal Medicine
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
## 2024-10-13 ENCOUNTER — Ambulatory Visit: Admitting: Physician Assistant

## 2024-10-13 ENCOUNTER — Encounter: Payer: Self-pay | Admitting: Physician Assistant

## 2024-10-13 VITALS — BP 108/70 | HR 77 | Ht 62.0 in | Wt 159.2 lb

## 2024-10-13 DIAGNOSIS — R143 Flatulence: Secondary | ICD-10-CM

## 2024-10-13 DIAGNOSIS — K581 Irritable bowel syndrome with constipation: Secondary | ICD-10-CM

## 2024-10-13 DIAGNOSIS — R140 Abdominal distension (gaseous): Secondary | ICD-10-CM

## 2024-10-13 DIAGNOSIS — R1084 Generalized abdominal pain: Secondary | ICD-10-CM

## 2024-10-13 MED ORDER — IBSRELA 50 MG PO TABS: 50.0000 mg | ORAL_TABLET | Freq: Two times a day (BID) | ORAL | 11 refills | Status: AC

## 2024-10-13 NOTE — Patient Instructions (Addendum)
" ° °  For Irritable Bowel Syndrome / Colon Spasm / Abdominal Cramps: IB Gard (Peppermint Oil) - Over the Counter Take 2 capsules Twice daily   Please start Ibsrela  50 mg 1 tablet twice daily, samples and prescription given to help constipation and IBS.  Recommend a low FODMAP diet, handout given.  I am ordering a test to check for small intestine bacterial overgrowth.    Due to recent changes in healthcare laws, you may see the results of your imaging and laboratory studies on MyChart before your provider has had a chance to review them.  We understand that in some cases there may be results that are confusing or concerning to you. Not all laboratory results come back in the same time frame and the provider may be waiting for multiple results in order to interpret others.  Please give us  48 hours in order for your provider to thoroughly review all the results before contacting the office for clarification of your results.   Thank you for trusting me with your gastrointestinal care!   Ellouise Console, PA-C _______________________________________________________  If your blood pressure at your visit was 140/90 or greater, please contact your primary care physician to follow up on this.  _______________________________________________________  If you are age 38 or older, your body mass index should be between 23-30. Your Body mass index is 29.12 kg/m. If this is out of the aforementioned range listed, please consider follow up with your Primary Care Provider.  If you are age 73 or younger, your body mass index should be between 19-25. Your Body mass index is 29.12 kg/m. If this is out of the aformentioned range listed, please consider follow up with your Primary Care Provider.   ________________________________________________________  The [facility] providers would like to encourage you to use MYCHART to communicate with providers for non-urgent requests or questions.  Due to long hold times  on the telephone, sending your provider a message by [facility] may be a faster and more efficient way to get a response.  Please allow 48 business hours for a response.  Please remember that this is for non-urgent requests.  _______________________________________________________  "

## 2024-10-13 NOTE — Progress Notes (Signed)
 Agree with assessment plan as outlined. For her bloating may also want to consider FODZYME supplement, we may have samples in the office to offer her.
